# Patient Record
Sex: Male | Born: 1950 | Race: White | Hispanic: No | Marital: Single | Smoking: Never smoker
Health system: Southern US, Community
[De-identification: ages and names within clinical notes are randomized; demographics above are authoritative.]

## PROBLEM LIST (undated history)

## (undated) DIAGNOSIS — Z87442 Personal history of urinary calculi: Secondary | ICD-10-CM

## (undated) DIAGNOSIS — E785 Hyperlipidemia, unspecified: Secondary | ICD-10-CM

## (undated) DIAGNOSIS — K589 Irritable bowel syndrome without diarrhea: Secondary | ICD-10-CM

## (undated) DIAGNOSIS — Z8619 Personal history of other infectious and parasitic diseases: Secondary | ICD-10-CM

## (undated) DIAGNOSIS — J45909 Unspecified asthma, uncomplicated: Secondary | ICD-10-CM

## (undated) DIAGNOSIS — K219 Gastro-esophageal reflux disease without esophagitis: Secondary | ICD-10-CM

## (undated) DIAGNOSIS — R51 Headache: Secondary | ICD-10-CM

## (undated) DIAGNOSIS — B0229 Other postherpetic nervous system involvement: Secondary | ICD-10-CM

## (undated) HISTORY — PX: COLONOSCOPY: SHX174

## (undated) HISTORY — DX: Personal history of urinary calculi: Z87.442

## (undated) HISTORY — DX: Irritable bowel syndrome, unspecified: K58.9

## (undated) HISTORY — DX: Hyperlipidemia, unspecified: E78.5

## (undated) HISTORY — DX: Gastro-esophageal reflux disease without esophagitis: K21.9

## (undated) HISTORY — DX: Other postherpetic nervous system involvement: B02.29

## (undated) HISTORY — PX: OTHER SURGICAL HISTORY: SHX169

## (undated) HISTORY — DX: Personal history of other infectious and parasitic diseases: Z86.19

## (undated) HISTORY — DX: Unspecified asthma, uncomplicated: J45.909

## (undated) HISTORY — DX: Headache: R51

---
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
## 1995-10-28 HISTORY — PX: OTHER SURGICAL HISTORY: SHX169

## 1999-01-28 ENCOUNTER — Other Ambulatory Visit: Admission: RE | Admit: 1999-01-28 | Discharge: 1999-01-28 | Payer: Self-pay | Admitting: Otolaryngology

## 2001-03-21 ENCOUNTER — Encounter: Payer: Self-pay | Admitting: Emergency Medicine

## 2001-03-21 ENCOUNTER — Inpatient Hospital Stay (HOSPITAL_COMMUNITY): Admission: EM | Admit: 2001-03-21 | Discharge: 2001-03-22 | Payer: Self-pay | Admitting: Emergency Medicine

## 2002-06-09 ENCOUNTER — Ambulatory Visit (HOSPITAL_COMMUNITY): Admission: RE | Admit: 2002-06-09 | Discharge: 2002-06-09 | Payer: Self-pay | Admitting: Pulmonary Disease

## 2002-06-09 ENCOUNTER — Encounter: Payer: Self-pay | Admitting: Pulmonary Disease

## 2002-07-05 ENCOUNTER — Encounter: Payer: Self-pay | Admitting: Neurology

## 2002-07-05 ENCOUNTER — Ambulatory Visit (HOSPITAL_COMMUNITY): Admission: RE | Admit: 2002-07-05 | Discharge: 2002-07-05 | Payer: Self-pay | Admitting: Neurology

## 2002-07-22 ENCOUNTER — Ambulatory Visit (HOSPITAL_BASED_OUTPATIENT_CLINIC_OR_DEPARTMENT_OTHER): Admission: RE | Admit: 2002-07-22 | Discharge: 2002-07-22 | Payer: Self-pay | Admitting: Urology

## 2002-07-22 ENCOUNTER — Encounter: Payer: Self-pay | Admitting: Urology

## 2002-10-27 HISTORY — PX: OTHER SURGICAL HISTORY: SHX169

## 2003-03-21 ENCOUNTER — Emergency Department (HOSPITAL_COMMUNITY): Admission: EM | Admit: 2003-03-21 | Discharge: 2003-03-21 | Payer: Self-pay | Admitting: Emergency Medicine

## 2003-08-25 ENCOUNTER — Ambulatory Visit (HOSPITAL_COMMUNITY): Admission: RE | Admit: 2003-08-25 | Discharge: 2003-08-25 | Payer: Self-pay | Admitting: Urology

## 2003-08-25 ENCOUNTER — Ambulatory Visit (HOSPITAL_BASED_OUTPATIENT_CLINIC_OR_DEPARTMENT_OTHER): Admission: RE | Admit: 2003-08-25 | Discharge: 2003-08-25 | Payer: Self-pay | Admitting: Urology

## 2004-09-11 ENCOUNTER — Ambulatory Visit: Payer: Self-pay | Admitting: Pulmonary Disease

## 2004-10-02 ENCOUNTER — Ambulatory Visit: Payer: Self-pay | Admitting: Adult Health

## 2004-11-18 ENCOUNTER — Ambulatory Visit: Payer: Self-pay | Admitting: Pulmonary Disease

## 2005-02-13 ENCOUNTER — Emergency Department (HOSPITAL_COMMUNITY): Admission: EM | Admit: 2005-02-13 | Discharge: 2005-02-13 | Payer: Self-pay | Admitting: Emergency Medicine

## 2005-02-17 ENCOUNTER — Ambulatory Visit (HOSPITAL_COMMUNITY): Admission: RE | Admit: 2005-02-17 | Discharge: 2005-02-17 | Payer: Self-pay | Admitting: Urology

## 2005-03-10 ENCOUNTER — Ambulatory Visit: Payer: Self-pay | Admitting: Pulmonary Disease

## 2005-03-28 ENCOUNTER — Ambulatory Visit: Payer: Self-pay | Admitting: Pulmonary Disease

## 2005-04-09 ENCOUNTER — Ambulatory Visit: Payer: Self-pay | Admitting: Pulmonary Disease

## 2005-05-12 ENCOUNTER — Ambulatory Visit: Payer: Self-pay | Admitting: Pulmonary Disease

## 2005-05-20 ENCOUNTER — Encounter: Admission: RE | Admit: 2005-05-20 | Discharge: 2005-05-20 | Payer: Self-pay | Admitting: Neurology

## 2005-06-02 ENCOUNTER — Ambulatory Visit (HOSPITAL_COMMUNITY): Admission: RE | Admit: 2005-06-02 | Discharge: 2005-06-02 | Payer: Self-pay | Admitting: Neurology

## 2005-10-30 ENCOUNTER — Ambulatory Visit: Payer: Self-pay | Admitting: Pulmonary Disease

## 2005-12-09 ENCOUNTER — Ambulatory Visit: Payer: Self-pay | Admitting: Pulmonary Disease

## 2005-12-18 ENCOUNTER — Ambulatory Visit: Payer: Self-pay | Admitting: Pulmonary Disease

## 2005-12-26 ENCOUNTER — Ambulatory Visit: Payer: Self-pay | Admitting: Pulmonary Disease

## 2006-01-12 ENCOUNTER — Ambulatory Visit: Payer: Self-pay | Admitting: Pulmonary Disease

## 2006-05-19 ENCOUNTER — Ambulatory Visit: Payer: Self-pay | Admitting: Pulmonary Disease

## 2006-07-22 ENCOUNTER — Emergency Department (HOSPITAL_COMMUNITY): Admission: EM | Admit: 2006-07-22 | Discharge: 2006-07-22 | Payer: Self-pay | Admitting: Emergency Medicine

## 2006-07-28 ENCOUNTER — Ambulatory Visit: Payer: Self-pay | Admitting: Pulmonary Disease

## 2006-09-21 ENCOUNTER — Ambulatory Visit: Payer: Self-pay | Admitting: Pulmonary Disease

## 2006-09-24 ENCOUNTER — Encounter: Admission: RE | Admit: 2006-09-24 | Discharge: 2006-09-24 | Payer: Self-pay | Admitting: Otolaryngology

## 2007-01-11 ENCOUNTER — Ambulatory Visit: Payer: Self-pay | Admitting: Pulmonary Disease

## 2007-03-08 ENCOUNTER — Ambulatory Visit: Payer: Self-pay | Admitting: Pulmonary Disease

## 2007-03-09 ENCOUNTER — Ambulatory Visit: Payer: Self-pay | Admitting: Pulmonary Disease

## 2007-03-09 LAB — CONVERTED CEMR LAB
ALT: 19 units/L (ref 0–40)
Alkaline Phosphatase: 72 units/L (ref 39–117)
BUN: 14 mg/dL (ref 6–23)
Basophils Relative: 0 % (ref 0.0–1.0)
CO2: 29 meq/L (ref 19–32)
Chloride: 107 meq/L (ref 96–112)
Cholesterol: 149 mg/dL (ref 0–200)
Eosinophils Absolute: 0.1 10*3/uL (ref 0.0–0.6)
Eosinophils Relative: 1 % (ref 0.0–5.0)
GFR calc Af Amer: 89 mL/min
GFR calc non Af Amer: 74 mL/min
Glucose, Bld: 117 mg/dL — ABNORMAL HIGH (ref 70–99)
HCT: 41.9 % (ref 39.0–52.0)
HDL: 39.1 mg/dL (ref >39.0)
Monocytes Absolute: 0.5 10*3/uL (ref 0.2–0.7)
Neutro Abs: 3 10*3/uL (ref 1.4–7.7)
Neutrophils Relative %: 51.4 % (ref 43.0–77.0)
Potassium: 4.5 meq/L (ref 3.5–5.1)
RBC: 4.79 M/uL (ref 4.22–5.81)
Sed Rate: 9 mm/hr (ref 0–20)
Sodium: 143 meq/L (ref 135–145)
TSH: 1.78 microintl units/mL (ref 0.35–5.50)
Total CHOL/HDL Ratio: 3.8
Total Protein: 7.1 g/dL (ref 6.0–8.3)
Triglycerides: 188 mg/dL — ABNORMAL HIGH (ref 0–149)

## 2007-03-23 ENCOUNTER — Ambulatory Visit: Payer: Self-pay | Admitting: Pulmonary Disease

## 2007-04-09 ENCOUNTER — Ambulatory Visit: Payer: Self-pay | Admitting: Pulmonary Disease

## 2007-06-01 ENCOUNTER — Ambulatory Visit: Payer: Self-pay | Admitting: Pulmonary Disease

## 2007-06-04 ENCOUNTER — Ambulatory Visit: Payer: Self-pay | Admitting: Pulmonary Disease

## 2007-06-10 ENCOUNTER — Ambulatory Visit: Payer: Self-pay | Admitting: Pulmonary Disease

## 2007-08-28 DIAGNOSIS — J309 Allergic rhinitis, unspecified: Secondary | ICD-10-CM | POA: Insufficient documentation

## 2007-08-28 DIAGNOSIS — J209 Acute bronchitis, unspecified: Secondary | ICD-10-CM | POA: Insufficient documentation

## 2007-08-28 DIAGNOSIS — K219 Gastro-esophageal reflux disease without esophagitis: Secondary | ICD-10-CM

## 2007-08-28 DIAGNOSIS — E785 Hyperlipidemia, unspecified: Secondary | ICD-10-CM

## 2007-11-07 ENCOUNTER — Emergency Department (HOSPITAL_COMMUNITY): Admission: EM | Admit: 2007-11-07 | Discharge: 2007-11-07 | Payer: Self-pay | Admitting: Emergency Medicine

## 2007-11-11 ENCOUNTER — Ambulatory Visit (HOSPITAL_COMMUNITY): Admission: RE | Admit: 2007-11-11 | Discharge: 2007-11-11 | Payer: Self-pay | Admitting: Urology

## 2007-11-23 ENCOUNTER — Encounter: Payer: Self-pay | Admitting: Adult Health

## 2007-11-23 ENCOUNTER — Ambulatory Visit: Payer: Self-pay | Admitting: Pulmonary Disease

## 2007-11-23 DIAGNOSIS — B0229 Other postherpetic nervous system involvement: Secondary | ICD-10-CM

## 2007-11-26 ENCOUNTER — Encounter: Payer: Self-pay | Admitting: Adult Health

## 2007-12-03 ENCOUNTER — Telehealth: Payer: Self-pay | Admitting: Adult Health

## 2007-12-03 ENCOUNTER — Encounter: Payer: Self-pay | Admitting: Adult Health

## 2007-12-08 ENCOUNTER — Ambulatory Visit: Payer: Self-pay | Admitting: Pulmonary Disease

## 2007-12-14 ENCOUNTER — Encounter: Payer: Self-pay | Admitting: Pulmonary Disease

## 2007-12-23 ENCOUNTER — Telehealth (INDEPENDENT_AMBULATORY_CARE_PROVIDER_SITE_OTHER): Payer: Self-pay | Admitting: *Deleted

## 2008-02-02 ENCOUNTER — Ambulatory Visit: Payer: Self-pay | Admitting: Pulmonary Disease

## 2008-02-02 LAB — CONVERTED CEMR LAB: Streptococcus, Group A Screen (Direct): NEGATIVE

## 2008-04-03 ENCOUNTER — Telehealth (INDEPENDENT_AMBULATORY_CARE_PROVIDER_SITE_OTHER): Payer: Self-pay | Admitting: *Deleted

## 2008-04-19 ENCOUNTER — Telehealth: Payer: Self-pay | Admitting: Pulmonary Disease

## 2008-08-07 ENCOUNTER — Ambulatory Visit: Payer: Self-pay | Admitting: Pulmonary Disease

## 2008-10-12 ENCOUNTER — Telehealth (INDEPENDENT_AMBULATORY_CARE_PROVIDER_SITE_OTHER): Payer: Self-pay | Admitting: *Deleted

## 2008-11-06 ENCOUNTER — Ambulatory Visit: Payer: Self-pay | Admitting: Internal Medicine

## 2008-11-07 ENCOUNTER — Telehealth: Payer: Self-pay | Admitting: Adult Health

## 2009-01-11 ENCOUNTER — Ambulatory Visit: Payer: Self-pay | Admitting: Pulmonary Disease

## 2009-01-17 ENCOUNTER — Ambulatory Visit: Payer: Self-pay | Admitting: Pulmonary Disease

## 2009-01-17 DIAGNOSIS — R51 Headache: Secondary | ICD-10-CM | POA: Insufficient documentation

## 2009-01-17 DIAGNOSIS — K649 Unspecified hemorrhoids: Secondary | ICD-10-CM | POA: Insufficient documentation

## 2009-01-17 DIAGNOSIS — N200 Calculus of kidney: Secondary | ICD-10-CM | POA: Insufficient documentation

## 2009-01-17 DIAGNOSIS — R519 Headache, unspecified: Secondary | ICD-10-CM | POA: Insufficient documentation

## 2009-01-17 DIAGNOSIS — B029 Zoster without complications: Secondary | ICD-10-CM | POA: Insufficient documentation

## 2009-01-17 LAB — CONVERTED CEMR LAB
BUN: 18 mg/dL (ref 6–23)
Basophils Absolute: 0 10*3/uL (ref 0.0–0.1)
Basophils Relative: 0.3 % (ref 0.0–3.0)
CO2: 28 meq/L (ref 19–32)
Calcium: 8.9 mg/dL (ref 8.4–10.5)
Chloride: 107 meq/L (ref 96–112)
Cholesterol: 145 mg/dL (ref 0–200)
Creatinine, Ser: 1 mg/dL (ref 0.4–1.5)
Eosinophils Absolute: 0.1 10*3/uL (ref 0.0–0.7)
Glucose, Bld: 102 mg/dL — ABNORMAL HIGH (ref 70–99)
HDL: 40 mg/dL (ref >39.00)
LDL Cholesterol: 82 mg/dL (ref 0–99)
MCV: 88.5 fL (ref 78.0–100.0)
Monocytes Absolute: 0.5 10*3/uL (ref 0.1–1.0)
Monocytes Relative: 9.3 % (ref 3.0–12.0)
Neutro Abs: 2.4 10*3/uL (ref 1.4–7.7)
Potassium: 3.9 meq/L (ref 3.5–5.1)
Sodium: 142 meq/L (ref 135–145)
Total CHOL/HDL Ratio: 4
Triglycerides: 114 mg/dL (ref 0.0–149.0)
Urine Glucose: NEGATIVE mg/dL
VLDL: 22.8 mg/dL (ref 0.0–40.0)

## 2009-01-26 DIAGNOSIS — E559 Vitamin D deficiency, unspecified: Secondary | ICD-10-CM | POA: Insufficient documentation

## 2009-02-05 ENCOUNTER — Ambulatory Visit: Payer: Self-pay | Admitting: Internal Medicine

## 2009-02-06 DIAGNOSIS — L219 Seborrheic dermatitis, unspecified: Secondary | ICD-10-CM | POA: Insufficient documentation

## 2009-03-09 ENCOUNTER — Telehealth (INDEPENDENT_AMBULATORY_CARE_PROVIDER_SITE_OTHER): Payer: Self-pay | Admitting: *Deleted

## 2009-03-14 ENCOUNTER — Encounter: Payer: Self-pay | Admitting: Pulmonary Disease

## 2009-05-02 ENCOUNTER — Ambulatory Visit: Payer: Self-pay | Admitting: Internal Medicine

## 2009-05-30 IMAGING — CR DG ABDOMEN 1V
2 series · 2 of 2 positions shown · non-contrast
Comparison: 02/17/2005

CLINICAL DATA: Preop evaluation for left ureteral stone

[view not recorded (1 of 2)]
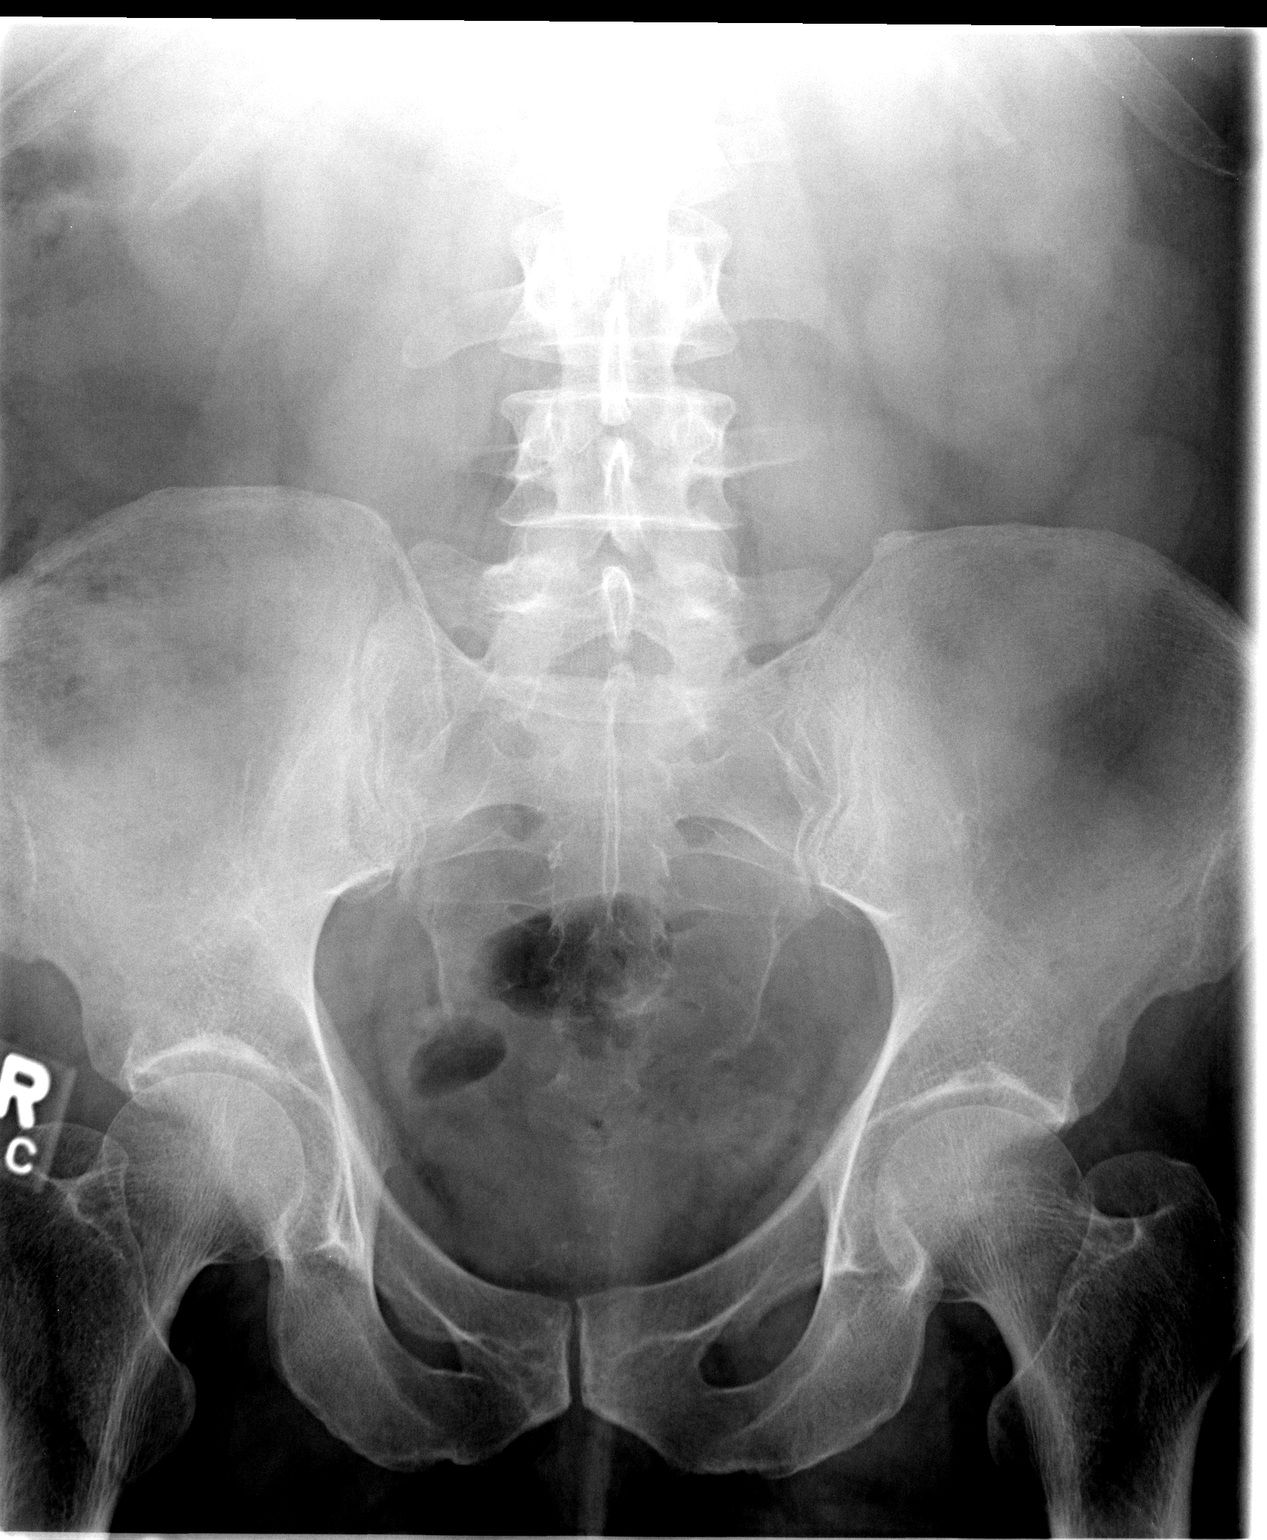

[view not recorded (2 of 2)]
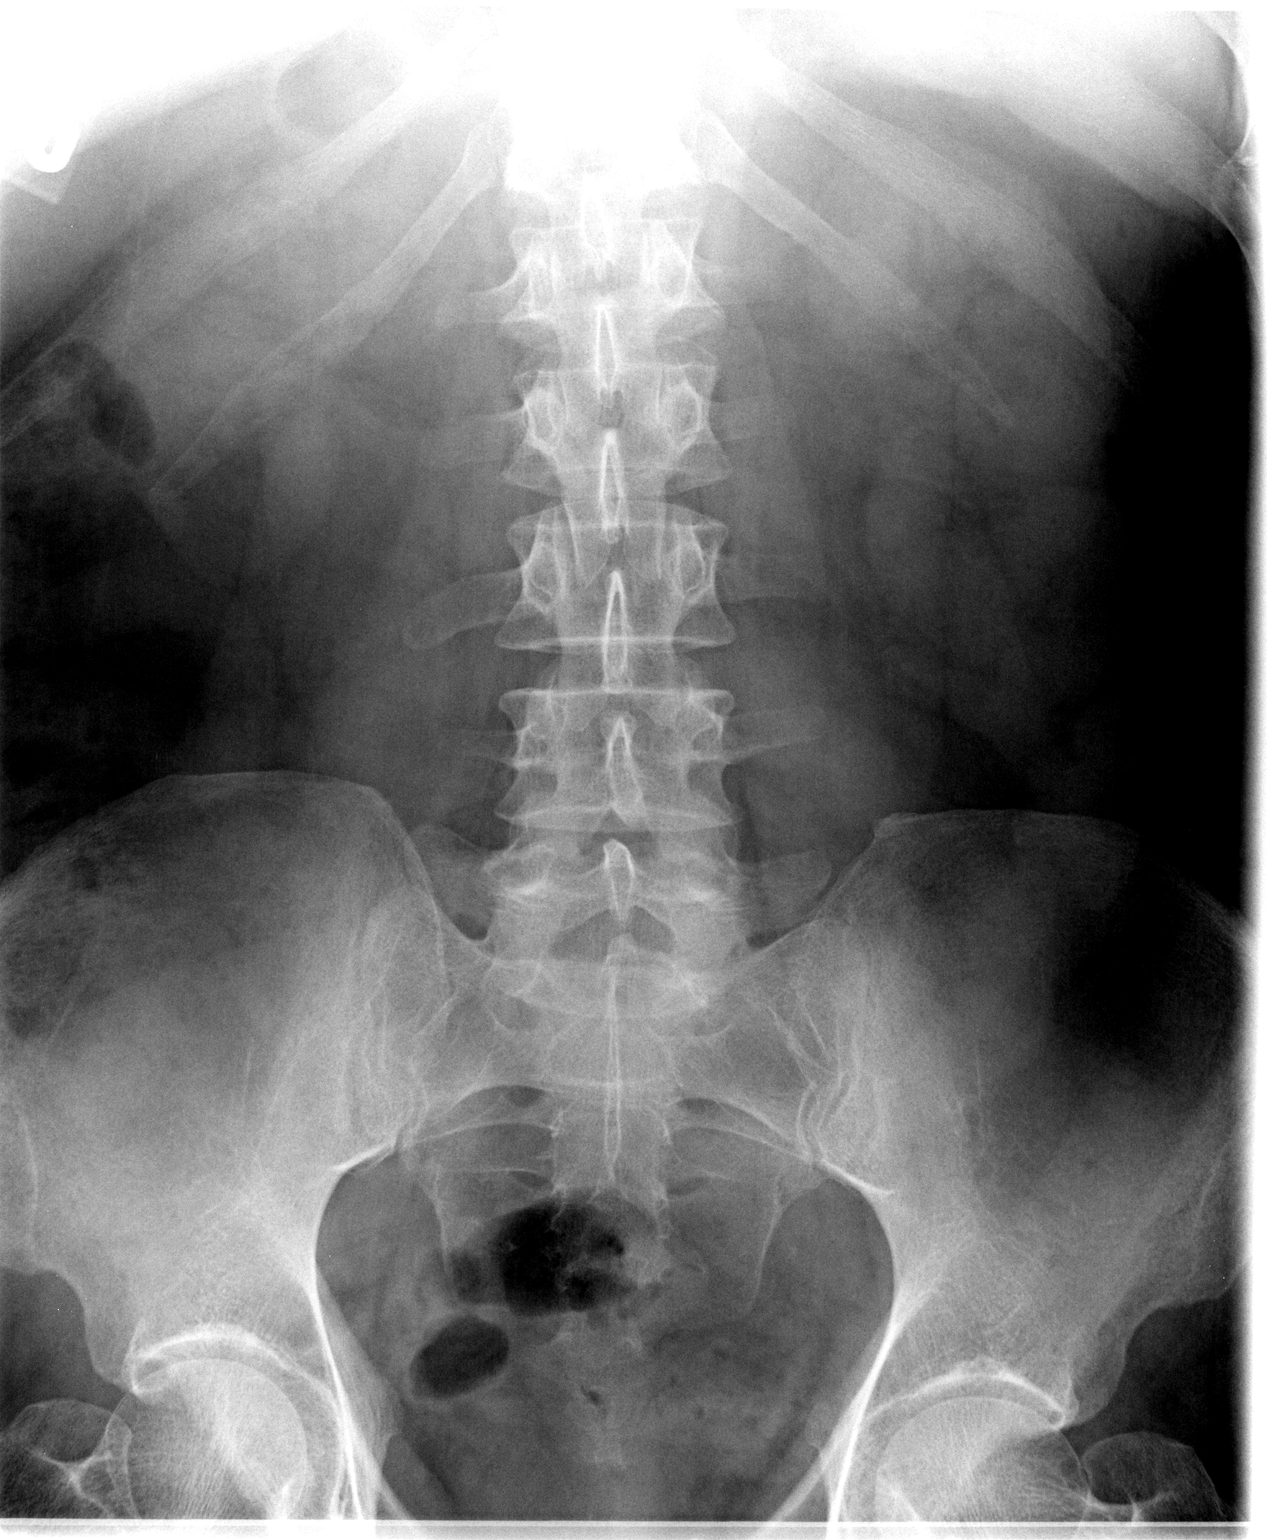

[2 of 2 positions shown; findings below may reference images not displayed]

ABDOMEN - 1 VIEW:

No definite renal or ureteral stone is identified on either side. There is a
faint opacity adjacent to the left sacrum which could be a tiny distal ureteral
stone. Visualized bony structures are unremarkable.
IMPRESSION: No definite renal or ureteral stones.

## 2009-06-11 ENCOUNTER — Telehealth (INDEPENDENT_AMBULATORY_CARE_PROVIDER_SITE_OTHER): Payer: Self-pay | Admitting: *Deleted

## 2009-08-09 ENCOUNTER — Ambulatory Visit: Payer: Self-pay | Admitting: Internal Medicine

## 2009-10-23 ENCOUNTER — Telehealth: Payer: Self-pay | Admitting: Pulmonary Disease

## 2009-12-03 ENCOUNTER — Ambulatory Visit: Payer: Self-pay | Admitting: Pulmonary Disease

## 2010-07-05 ENCOUNTER — Telehealth: Payer: Self-pay | Admitting: Pulmonary Disease

## 2010-07-08 ENCOUNTER — Ambulatory Visit: Payer: Self-pay | Admitting: Pulmonary Disease

## 2010-07-08 DIAGNOSIS — R1011 Right upper quadrant pain: Secondary | ICD-10-CM

## 2010-07-08 DIAGNOSIS — K589 Irritable bowel syndrome without diarrhea: Secondary | ICD-10-CM | POA: Insufficient documentation

## 2010-07-08 LAB — CONVERTED CEMR LAB
ALT: 19 units/L (ref 0–53)
AST: 17 units/L (ref 0–37)
Alkaline Phosphatase: 88 units/L (ref 39–117)
Amylase: 19 units/L — ABNORMAL LOW (ref 27–131)
Basophils Relative: 0.4 % (ref 0.0–3.0)
Bilirubin, Direct: 0.1 mg/dL (ref 0.0–0.3)
Calcium: 9.2 mg/dL (ref 8.4–10.5)
Chloride: 105 meq/L (ref 96–112)
Eosinophils Relative: 0.7 % (ref 0.0–5.0)
GFR calc non Af Amer: 79.51 mL/min (ref >60)
HCT: 40.8 % (ref 39.0–52.0)
Hemoglobin: 13.8 g/dL (ref 13.0–17.0)
Lymphocytes Relative: 32.4 % (ref 12.0–46.0)
Lymphs Abs: 2.4 10*3/uL (ref 0.7–4.0)
MCHC: 33.8 g/dL (ref 30.0–36.0)
MCV: 86.5 fL (ref 78.0–100.0)
Monocytes Absolute: 0.5 10*3/uL (ref 0.1–1.0)
Neutro Abs: 4.3 10*3/uL (ref 1.4–7.7)
Potassium: 4.7 meq/L (ref 3.5–5.1)
RBC: 4.72 M/uL (ref 4.22–5.81)
RDW: 14.9 % — ABNORMAL HIGH (ref 11.5–14.6)
Sed Rate: 20 mm/hr (ref 0–22)
Sodium: 142 meq/L (ref 135–145)
Total Bilirubin: 0.6 mg/dL (ref 0.3–1.2)
WBC: 7.3 10*3/uL (ref 4.5–10.5)

## 2010-07-09 ENCOUNTER — Ambulatory Visit (HOSPITAL_COMMUNITY): Admission: RE | Admit: 2010-07-09 | Discharge: 2010-07-09 | Payer: Self-pay | Admitting: Pulmonary Disease

## 2010-08-09 ENCOUNTER — Telehealth (INDEPENDENT_AMBULATORY_CARE_PROVIDER_SITE_OTHER): Payer: Self-pay | Admitting: *Deleted

## 2010-11-06 ENCOUNTER — Ambulatory Visit
Admission: RE | Admit: 2010-11-06 | Discharge: 2010-11-06 | Payer: Self-pay | Source: Home / Self Care | Attending: Pulmonary Disease | Admitting: Pulmonary Disease

## 2010-11-06 ENCOUNTER — Encounter: Payer: Self-pay | Admitting: Pulmonary Disease

## 2010-11-28 NOTE — Assessment & Plan Note (Signed)
Summary: sinus/knot on back   Primary Care Provider:  Dr. Kriste Basque   CC:  4 month ROV & add-on for sinus infection....  History of Present Illness: 60 y/o WM here for a follow up...  he has multiple medical problems as noted below...    ~  July 08, 2010:  last seen by me 3/10 for CPX & f/u of his AR, AB, Hyperlipidemia, GERD, etc... presents today w/ 3wk hx RUQ pain- below right costal margin, tender, hurts to roll over at night but NOT tender to palp on ribs of lower chest wall- assoc nausea, no vomit, +bloating, no ch in bowels, no hx GB dis or prev eval... we discussed checking Sonar & Labs>    Otherw stable- takes Levsin, Zantac as needed;  breathing satis w/o cough, SOB, etc;  no CP, paklpit, etc;  uses Valium 2mg  as needed for vertigo...   ~  November 06, 2010:  Add-on for sinusitis c/o congestion, drainage, blowing yellow "crud" for 1 wk w/ cough & beige sputum... ?fever, +chills, body aches, some HA, fatigue,etc... taking OTC Tylenol only & feeling poorly asking for 3d off work due to systemic symptoms... we discussed Rx w/ Pred, Augmentin, Mucinex, etc...   Current Problems:   ALLERGIC RHINITIS (ICD-477.9) & OTITIS MEDIA, LEFT (ICD-382.9) - uses OTC antihistamines + FLONASE... hx otitis media requiring ENT- w/ bilat tympanoplasties by DrCrossly in the past...  hx Vertigo w/ Rx Valium 2mg  in past- he had an extensive eval by DrRosen & [facility] DrMay w/ rec for [facility]  for Otolith disease...  Hx of ASTHMATIC BRONCHITIS, ACUTE (ICD-466.0) - he is a non-smoker w/ hx of recurrent bronchitic infections...  HYPERLIPIDEMIA (ICD-272.4) - on SIMVASTATIN 40mg /d...  ~  FLP 3/10 showed TChol 145, TG 114, HDL 40, LDL 82... rec> continue same.  GERD (ICD-530.81) - prev on Prilosec Prn...  ~  9/11: presented w/ 3wk hx RUQ pain> Labs all neg & Sonar- pending...  HEMORRHOIDS (ICD-455.6) - colonoscopy 8/01 by DrPatterson was neg x for some min int hems...  Hx of RENAL CALCULUS (ICD-592.0) - he  had a kidney stone on the left requiring cystoscopic stone extraction in 1997 by DrRDavis et al...  HEADACHE (ICD-784.0) - eval by DrWeymann in 7/06...  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 of decr flow in right vertebral & ? 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 subseq cerebral angiogram by DrTDeveshwar showed  Hx of HERPES ZOSTER (ICD-053.9) & POSTHERPETIC NEURALGIA (ICD-053.19) - seen 8/08 with right V1shingles- facial/eye/scalp herpes zoster, treated with Valtrex and Prednisone taper... seen by Optometrist and recommend to use suppressive anti-virals for 6 month with acyclovir- labs revealed a negative HIV test, and postive HSV I/II  IgM titer and negative HSV 2 IgG Ab... he required Lyrica for the postherpetic neuralgia and it resolved.  VITAMIN D DEFICIENCY (ICD-268.9) - Vit D level 3/10 = 18... Vit D 50,000 u weekly started.  Health Maintenance - takes ASA 81mg /d...  ~  f/u colonoscopy is now overdue ==> refer to GI DrPatterson.  ~  labs 3/10 showed PSA= 0.71;  on Viagra Prn...  ~  Immunizations:  encouraged to get yearly Flu vaccine each autumn.   Allergies: 1)  * Contac Cold Med  Comments:  Nurse/Medical Assistant: The patient's medications and allergies were reviewed with the patient and were updated in the Medication and Allergy Lists. 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 both parents are living and healthy mother has hypertension and diabetes  Social History: Reviewed history from 07/08/2010 and no changes required. 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  The patient denies anorexia, fever, weight loss, weight gain, vision loss, decreased hearing, chest pain, syncope, peripheral edema, headaches, hemoptysis, abdominal pain, melena, hematochezia, severe indigestion/heartburn, hematuria, incontinence, muscle weakness, suspicious skin lesions, transient blindness, difficulty walking, depression, unusual weight change, abnormal bleeding, enlarged lymph nodes, and angioedema.    Vital Signs:  Patient profile:   60 year old male Height:      70 inches Weight:      240 pounds O2 Sat:      98 % on Room air Temp:     97.7 degrees F oral Pulse rate:   89 / minute BP sitting:   146 / 86  (left arm) Cuff size:   regular  Vitals Entered By: Boone Master CNA/MA (November 06, 2010 11:41 AM)  O2 Flow:  Room air  Physical Exam  Additional Exam:  WD, WN, 60 y/o WM in NAD... GENERAL:  Alert & oriented; pleasant & cooperative... HEENT:  [place]/AT, EOM-wnl, PERRLA, Fundi-benign, EACs-Left ear tube in place, TMs-wnl,  NOSE-pale w/ clear discharge , THROAT-clear & wnl.   NECK:  Supple w/ full ROM; no JVD; normal carotid impulses w/o bruits; no thyromegaly or nodules palpated; no lymphadenopathy. CHEST:  Clear to P & A; without wheezes/ rales/ or rhonchi. HEART:  Regular Rhythm; without murmurs/ rubs/ or gallops. ABD: Soft, +RUQ tender on palp but costal margin is NOT tender, norm BS, no organomegaly or masses palp... EXT: w/o deformities, mild  arthritic changes, no VV, mild VI, no edema... NEURO:  CN's intact, no focal neuro deficits... DERM:  no lesions seen...    Impression & Recommendations:  Problem # 1:  Hx of ASTHMATIC BRONCHITIS, ACUTE (ICD-466.0) URI w/ Sinusitis & bronchitis... we discussed Rx w/ Depo/ Pred, Augmentin, ,Mucinex, etc... The following medications were removed from the medication list:    Zithromax Z-pak 250 Mg Tabs (Azithromycin) .Marland Kitchen... Take as directed His updated medication list for this problem includes:    Tussionex Pennkinetic Er 10-8 Mg/70ml Lqcr (Hydrocod polst-chlorphen polst) .Marland Kitchen... 1 tsp every 12 hours as needed for cough    Amoxicillin-pot Clavulanate 875-125 Mg Tabs (Amoxicillin-pot clavulanate) .Marland Kitchen... 1 tab by mouth two times a day til gone...  Orders: Admin of Therapeutic Inj  intramuscular or subcutaneous (98119) Depo- Medrol 80mg  (J1040)  Problem # 2:  HYPERLIPIDEMIA (ICD-272.4) Stable on Simva40... continue same. His updated medication list for this problem includes:    Simvastatin 40 Mg Tabs (Simvastatin) .Marland Kitchen... Take one tab by mouth once daily  Problem # 3:  GERD (ICD-530.81) Stable on meds>  continue same. His updated medication list for this problem includes:  Zantac 150 Mg Caps (Ranitidine hcl) .Marland Kitchen... Take 1 capsule by mouth once a day as needed    Levsin 0.125 Mg Tabs (Hyoscyamine sulfate) .Marland Kitchen... 1 by mouth three times a day as needed abdominal cramping  Problem # 4:  MULT MEDICAL PROBLES AS NOTED>>>  Complete Medication List: 1)  Fluticasone Propionate 50 Mcg/act Susp (Fluticasone propionate) .Marland Kitchen.. 1 spray each nostril two times a day 2)  Bayer Low Strength 81 Mg Tbec (Aspirin) .... Take one tab by mouth once daily 3)  Simvastatin 40 Mg Tabs (Simvastatin) .... Take one tab by mouth once daily 4)  Zantac 150 Mg Caps (Ranitidine hcl) .... Take 1 capsule by mouth once a day as needed 5)  Levsin 0.125 Mg Tabs (Hyoscyamine sulfate) .Marland Kitchen.. 1 by mouth three times a day as needed  abdominal cramping 6)  Viagra 100 Mg Tabs (Sildenafil citrate) .... Use as directed... 7)  Valium 2 Mg Tabs (Diazepam) .... Take one tablet by mouth every 6 hours as needed 8)  Vitamin D 81191 Unit Caps (Ergocalciferol) .... Take 1 cap by mouth each week... 9)  Tussionex Pennkinetic Er 10-8 Mg/82ml Lqcr (Hydrocod polst-chlorphen polst) .Marland Kitchen.. 1 tsp every 12 hours as needed for cough 10)  Amoxicillin-pot Clavulanate 875-125 Mg Tabs (Amoxicillin-pot clavulanate) .Marland Kitchen.. 1 tab by mouth two times a day til gone... 11)  Prednisone (pak) 5 Mg Tabs (Prednisone) .... Take as directed... 12)  Fluocinonide-e 0.05 % Crea (Fluocinonide emulsified base) .... Apply as directed...  Patient Instructions: 1)  Today we updated your med list- see below.... 2)  We decided to treat the sinus & bronchitis w/ a Depo shot & Pred Dosepak- take as directed;  AUGMENTIN one tab two times a day til gone;  & MUCINEX 2 tabs twice daily w/ plenty of fluids.Marland KitchenMarland Kitchen 3)  We also wrote for a cream as requested... 4)  Rest at home> you need 3-4d off work for this to resolve... 5)  Use Tylenol for the aches, fever, etc... 6)  Call for any questions... Prescriptions: FLUOCINONIDE-E 0.05 % CREA (FLUOCINONIDE EMULSIFIED BASE) apply as directed...  #1 tube x 1   Entered and Authorized by:   Michele Mcalpine MD   Signed by:   Michele Mcalpine MD on 11/06/2010   Method used:   Print then Give to Patient   RxID:   [id number] PREDNISONE (PAK) 5 MG TABS (PREDNISONE) take as directed...  #5mg -6d pack x 1   Entered and Authorized by:   Michele Mcalpine MD   Signed by:   Michele Mcalpine MD on 11/06/2010   Method used:   Print then Give to Patient   RxID:   [id number] AMOXICILLIN-POT CLAVULANATE 875-125 MG TABS (AMOXICILLIN-POT CLAVULANATE) 1 tab by mouth two times a day til gone...  #20 x 0   Entered and Authorized by:   Michele Mcalpine MD   Signed by:   Michele Mcalpine MD on 11/06/2010   Method used:   Print then Give to Patient   RxID:    [id number]    Medication Administration  Injection # 1:    Medication: Depo- Medrol 80mg     Diagnosis: Hx of ASTHMATIC BRONCHITIS, ACUTE (ICD-466.0)    Route: IM    Site: RUOQ gluteus    Exp Date: 04-2013    Lot #: [id number]    Mfr: Pharmacia    Patient tolerated injection without complications    Given by: Boone Master CNA/MA (November 06, 2010 12:24 PM)  Orders Added:  1)  Est. Patient Level IV [16109] 2)  Admin of Therapeutic Inj  intramuscular or subcutaneous [96372] 3)  Depo- Medrol 80mg  [J1040]

## 2010-11-28 NOTE — Assessment & Plan Note (Signed)
Summary: rt side pain/nausea/la   Primary Care Provider:  Dr. Kriste Basque   CC:  18 month ROV & eval right side pain....  History of Present Illness: 60 y/o WM here for a follow up...  he has multiple medical problems as noted below...    ~  July 08, 2010:  last seen by me 3/10 for CPX & f/u of his AR, AB, Hyperlipidemia, GERD, etc... presents today w/ 3wk hx RUQ pain- below right costal margin, tender, hurts to roll over at night but NOT tender to palp on ribs of lower chest wall- assoc nausea, no vomit, +bloating, no ch in bowels, no hx GB dis or prev eval... we discussed checking Sonar & Labs>    Otherw stable- takes Levsin, Zantac as needed;  breathing satis w/o cough, SOB, etc;  no CP, paklpit, etc;  uses Valium 2mg  as needed for vertigo...   Current Problems:   PHYSICAL EXAMINATION (ICD-V70.0) - takes ASA 81mg /d...  ~  f/u colonoscopy will be due 2011...  ~  labs 3/10 showed PSA= 0.71;  on Viagra Prn...  ~  Immunizations  ALLERGIC RHINITIS (ICD-477.9) & OTITIS MEDIA, LEFT (ICD-382.9) - uses OTC antihistamines + FLONASE... hx otitis media requiring ENT- w/ bilat tympanoplasties by DrCrossly in the past...  hx Vertigo w/ Rx Valium 2mg  in past- he had an extensive eval by DrRosen & [facility] DrMay w/ rec for [facility]  for Otolith disease...  Hx of ASTHMATIC BRONCHITIS, ACUTE (ICD-466.0) - he is a non-smoker w/ hx of recurrent bronchitic infections...  HYPERLIPIDEMIA (ICD-272.4) - on SIMVASTATIN 40mg /d...  ~  FLP 3/10 showed TChol 145, TG 114, HDL 40, LDL 82... rec> continue same.  GERD (ICD-530.81) - prev on Prilosec Prn...  ~  9/11: presented w/ 3wk hx RUQ pain> Labs all neg & Sonar- pending...  HEMORRHOIDS (ICD-455.6) - colonoscopy 8/01 by DrPatterson was neg x for some min int hems...  Hx of RENAL CALCULUS (ICD-592.0) - he had a kidney stone on the left requiring cystoscopic stone extraction in 1997 by DrRDavis et al...  HEADACHE (ICD-784.0) - eval by DrWeymann in 7/06...  ~   hx abnormal MRA 7/06 w/ ? of decr flow in right vertebral & ? 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 subseq cerebral angiogram by DrTDeveshwar showed  Hx of HERPES ZOSTER (ICD-053.9) & POSTHERPETIC NEURALGIA (ICD-053.19) - seen 8/08 with right V1shingles- facial/eye/scalp herpes zoster, treated with Valtrex and Prednisone taper... seen by Optometrist and recommend to use suppressive anti-virals for 6 month with acyclovir- labs revealed a negative HIV test, and postive HSV I/II  IgM titer and negative HSV 2 IgG Ab... he required Lyrica for the postherpetic neuralgia and it resolved.  VITAMIN D DEFICIENCY (ICD-268.9) - Vit D level 3/10 = 18... Vit D 50,000 u weekly started.   Preventive Screening-Counseling & Management  Alcohol-Tobacco     Smoking Status: never  Allergies: 1)  * Contac Cold Med  Comments:  Nurse/Medical Assistant: The patient's medications and allergies were reviewed with the patient and were updated in the Medication and Allergy Lists.  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 both parents are living and healthy mother has hypertension and diabetes  Social History: Reviewed history from 05/02/2009 and no changes required. 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  The patient denies anorexia, fever, weight loss, weight gain, vision loss, decreased hearing, hoarseness, chest pain, syncope, dyspnea on exertion, peripheral edema, prolonged cough, headaches, hemoptysis, melena, hematochezia, severe indigestion/heartburn, hematuria, incontinence, muscle weakness, suspicious skin lesions, transient blindness, difficulty walking, depression, unusual weight change, abnormal bleeding, enlarged lymph nodes, and angioedema.    Vital Signs:  Patient profile:   60 year old male Height:      70 inches Weight:      233 pounds BMI:     33.55 O2 Sat:      97 % on Room air Temp:     98.7 degrees F oral Pulse rate:   76 / minute BP sitting:   128 / 78  (left arm) Cuff size:   regular  Vitals Entered By: Randell Loop CMA (July 08, 2010 9:52 AM)  O2 Sat at Rest %:  97 O2 Flow:  Room air CC: 18 month ROV & eval right side pain... Is Patient Diabetic? No Pain Assessment Patient in pain? yes      Onset of pain  rt side pain  Comments meds updated today with pt   Physical Exam  Additional Exam:  WD, WN, 60 y/o WM in NAD... GENERAL:  Alert & oriented; pleasant & cooperative... HEENT:  [place]/AT, EOM-wnl, PERRLA, Fundi-benign, EACs-Left ear tube in place, TMs-wnl,  NOSE-pale w/ clear discharge , THROAT-clear & wnl.   NECK:  Supple w/ full ROM; no JVD; normal carotid impulses w/o bruits; no thyromegaly or nodules palpated; no lymphadenopathy. CHEST:  Clear to P & A; without wheezes/ rales/ or rhonchi. HEART:  Regular Rhythm; without murmurs/ rubs/ or gallops. ABD: Soft, +RUQ tender on palp but costal margin is NOT tender, norm BS, no organomegaly or masses palp... EXT: w/o deformities, mild arthritic changes, no VV, mild VI, no edema... NEURO:  CN's intact, no focal neuro deficits... DERM:  no lesions seen...    MISC.  Report  Procedure date:  07/08/2010  Findings:      BMP (METABOL)   Sodium                    142 mEq/L                   135-145   Potassium                 4.7 mEq/L                   3.5-5.1   Chloride                  105 mEq/L                   96-112   Carbon Dioxide            28 mEq/L                    19-32   Glucose  95 mg/dL                    91-47   BUN                       19 mg/dL                    8-29   Creatinine                1.0 mg/dL                   5.6-2.1   Calcium                   9.2 mg/dL                   3.0-86.5   GFR                       79.51 mL/min                >60  Hepatic/Liver Function Panel (HEPATIC)   Total Bilirubin           0.6 mg/dL                   7.8-4.6   Direct Bilirubin          0.1 mg/dL                   9.6-2.9   Alkaline Phosphatase      88 U/L                      39-117   AST                       17 U/L                      0-37   ALT                       19 U/L                      0-53   Total Protein             7.2 g/dL                    5.2-8.4   Albumin                   4.1 g/dL                    1.3-2.4  CBC Platelet w/Diff (CBCD)   White Cell Count          7.3 K/uL                    4.5-10.5   Red Cell Count            4.72 Mil/uL                 4.22-5.81   Hemoglobin                13.8 g/dL                   40.1-02.7   Hematocrit  40.8 %                      39.0-52.0   MCV                       86.5 fl                     78.0-100.0   Platelet Count            273.0 K/uL                  150.0-400.0   Neutrophil %              59.1 %                      43.0-77.0   Lymphocyte %              32.4 %                      12.0-46.0   Monocyte %                7.4 %                       3.0-12.0   Eosinophils%              0.7 %                       0.0-5.0   Basophils %               0.4 %                       0.0-3.0  Comments:      TSH (TSH)   FastTSH                    3.17 uIU/mL                 0.35-5.50   Amylase (AMYL)   Amylase              [L]  19 U/L                      27-131  Lipase (LIPASE)   Lipase                    36.0 U/L                    11.0-59.0   Sed Rate (ESR)   Sed Rate                  20 mm/hr                    0-22   *** ABD ULTRASOUND *** pending at this time...   Impression & Recommendations:  Problem # 1:  ABDOMINAL PAIN, RIGHT UPPER QUADRANT (ICD-789.01) RUQ pain > r/o GB related... labs look OK, Sonar is pending... Orders: TLB-BMP (Basic Metabolic Panel-BMET) (80048-METABOL) TLB-Hepatic/Liver Function Pnl (80076-HEPATIC) TLB-CBC Platelet - w/Differential (85025-CBCD) TLB-TSH (Thyroid Stimulating Hormone) (84443-TSH) TLB-Amylase (82150-AMYL) TLB-Lipase (83690-LIPASE) TLB-Sedimentation Rate (ESR) (85652-ESR) T-Vitamin D (25-Hydroxy) (30865-78469) Ultrasound (Ultrasound)  Problem # 2:  ALLERGIC RHINITIS (ICD-477.9) Continue antihist & Flonase Rx... His updated medication list for this problem  includes:    Fluticasone Propionate 50 Mcg/act Susp (Fluticasone propionate) .Marland Kitchen... 1 spray each nostril two times a day  Problem # 3:  HYPERLIPIDEMIA (ICD-272.4)  Continue the Simva40 regularly & he will need FLP soon (no fasting today)... His updated medication list for this problem includes:    Simvastatin 40 Mg Tabs (Simvastatin) .Marland Kitchen... Take one tab by mouth once daily  Orders: TLB-BMP (Basic Metabolic Panel-BMET) (80048-METABOL) TLB-Hepatic/Liver Function Pnl (80076-HEPATIC) TLB-CBC Platelet - w/Differential (85025-CBCD) TLB-TSH (Thyroid Stimulating Hormone) (84443-TSH) TLB-Amylase (82150-AMYL) TLB-Lipase (83690-LIPASE) TLB-Sedimentation Rate (ESR) (85652-ESR) T-Vitamin D (25-Hydroxy) (16109-60454)  Problem # 4:  GERD (ICD-530.81) Encouraged to take the Zantac more regularly & given ACIPHEX 20mg /d samples until eval is completed... His updated medication list for this problem includes:    Zantac 150  Mg Caps (Ranitidine hcl) .Marland Kitchen... Take 1 capsule by mouth once a day as needed    Levsin 0.125 Mg Tabs (Hyoscyamine sulfate) .Marland Kitchen... 1 by mouth three times a day as needed abdominal cramping  Problem # 5:  HEMORRHOIDS (ICD-455.6) He is due for 66yr f/u colon w/ DrPatterson....  Problem # 6:  Hx of RENAL CALCULUS (ICD-592.0) Followed by Urology> no recent stones...   Problem # 7:  OTHER MEDICAL PROBLEMS AS NOTED>>>  Complete Medication List: 1)  Fluticasone Propionate 50 Mcg/act Susp (Fluticasone propionate) .Marland Kitchen.. 1 spray each nostril two times a day 2)  Bayer Low Strength 81 Mg Tbec (Aspirin) .... Take one tab by mouth once daily 3)  Simvastatin 40 Mg Tabs (Simvastatin) .... Take one tab by mouth once daily 4)  Zantac 150 Mg Caps (Ranitidine hcl) .... Take 1 capsule by mouth once a day as needed 5)  Levsin 0.125 Mg Tabs (Hyoscyamine sulfate) .Marland Kitchen.. 1 by mouth three times a day as needed abdominal cramping 6)  Viagra 100 Mg Tabs (Sildenafil citrate) .... Use as directed... 7)  Valium 2 Mg Tabs (Diazepam) .... Take one tablet by mouth every 6 hours as needed 8)  Vitamin D 09811 Unit Caps (Ergocalciferol) .... Take 1 cap by mouth each week...  Other Orders: Admin 1st Vaccine (91478) Flu Vaccine 68yrs + [id number])  Patient Instructions: 1)  Today we updated your med list- see below.... 2)  Continue your current meds the same for now & add the ACIPHEX 20mg  samples- one tab daily in the AM..Marland Kitchen 3)  We will arrange for an ASAP Abd Ulrasound exam to check your liver & GB.Marland KitchenMarland Kitchen 4)  Today we did your follow up lab work... 5)  We will call you w/ these results when avail.Marland KitchenMarland Kitchen 6)  Today we gave you the 2011 Flu vaccine... 7)  Call for any questions...   Flu Vaccine Consent Questions     Do you have a history of severe allergic reactions to this vaccine? no    Any prior history of allergic reactions to egg and/or gelatin? no    Do you have a sensitivity to the preservative Thimersol? no    Do you have a past  history of Guillan-Barre Syndrome? no    Do you currently have an acute febrile illness? no    Have you ever had a severe reaction to latex? no    Vaccine information given and explained to patient? yes    Are you currently pregnant? no    Lot Number:[id number]   Exp Date:04/26/2011   Site Given  Left Deltoid IMflu   Randell Loop [facility]  July 08, 2010 11:33 AM

## 2010-11-28 NOTE — Progress Notes (Signed)
Summary: sick-cough----rx for tussionex and z pak  Phone Note Call from Patient   Caller: Russell Bullock Call For: nadel Summary of Call: hacking cough, drainage nose runny feels real bad would like [facility] [phone] Initial call taken by: Oneita Jolly,  August 09, 2010 10:41 AM  Follow-up for Phone Call        called and spoke with pt.  pt last saw SN 07/08/2010.  Pt states Sx started approx 2 days ago.   Pt c/o scratchy throat, hoarseness, PND, dry-"hacky" cough throughout the day but states he will cough up "off-white colored" sputum in the mornings, runny nose with clear nasal drainage, head congestion, increased coughing when he takes in a deep breath.  Pt denied fever/chills/sweats. Pt states he is leaving to [facility] on "Sunday x 12 days and would like rx for Sx.  Will forward message to SN to address.  Megan Reynolds LPN  August 09, 2010 10:48 AM    Allergies:  1)  * Contac Cold Med  Additional Follow-up for Phone Call Additional follow up Details #1::        per sn ok for zpak #1 as directed, tussionex 4oz 1 tsp every 12hrs as needed for cough and otc mucinex 2 by mouth twice a day with fluids. Tammy Davis CMA  August 09, 2010 12:03 PM     Additional Follow-up for Phone Call Additional follow up Details #2::    called and spoke with pt.  pt aware of SN's recs and rx sent to pharmacy.  Megan Reynolds LPN  August 09, 2010 1:13 PM   New/Updated Medications: ZITHROMAX Z-PAK 250 MG TABS (AZITHROMYCIN) take as directed TUSSIONEX PENNKINETIC ER 10-8 MG/5ML LQCR (HYDROCOD POLST-CHLORPHEN POLST) 1 tsp every 12 hours as needed for cough Prescriptions: TUSSIONEX PENNKINETIC ER 10-8 MG/5ML LQCR (HYDROCOD POLST-CHLORPHEN POLST) 1 tsp every 12 hours as needed for cough  #4 oz x 0   Entered by:   Megan Reynolds LPN   Authorized by:   Scott M Nadel MD   Signed by:   Megan Reynolds LPN on 08/09/2010   Method used:   Telephoned to ...       [id number]       [address]       [place]       [place], [place]  [address]       Ph: [phone] or [phone]       Fax: [phone]   RxID:   [id number] ZITHROMAX Z-PAK 250 MG TABS (AZITHROMYCIN) take as directed  #1 x 0   Entered by:   Megan Reynolds LPN   Authorized by:   Scott M Nadel MD   Signed by:   Megan Reynolds LPN on 08/09/2010   Method used:   Telephoned to ...       [id number] ([facility]ail[facility]       [address]       [place], [place]  [address]       Ph: [phone] or [phone]       Fax: [phone]   RxID:   [id number]

## 2010-11-28 NOTE — Progress Notes (Signed)
Summary: pain in side-  Phone Note Call from Patient   Caller: Russell Bullock Call For: nadel Summary of Call: rt side pain below rib cage wants to be seen today Initial call taken by: Oneita Jolly,  July 05, 2010 8:45 AM  Follow-up for Phone Call        [id number]. [name] CMA  July 05, 2010 8:52 AM   Additional Follow-up for Phone Call Additional follow up Details #1::        Was informed by [name] that the pt called her back and that we should call his cell phone.  I called and spoke with pt.  He is c/o pain under rib area (rt side) x several days- pain worse when he presses on this area and makes him feel nauseated. Pain woke him up last night when he rolled over on his side  He states that he feels "bloated all the time".  No change in bowels, vomiting, or other complaints.  Has sched ov with TP for 07/08/10- I advised ER sooner if worsens.  Will forward to SN to see if has further recs. Additional Follow-up by: [name],  July 05, 2010 4:22 PM    Additional Follow-up for Phone Call Additional follow up Details #2::    per SN----??if severe---er now for eval  ---consider ?GB---called and spoke with pt and he stated that if he gets worse over the weekend he will go to the er for eval---he stated that he has not felt up to par today but he did make appt with TP on monday at 3---SN had a cancellation at 10am so we cancelled the appt with TP and made the one with SN.  pt is aware of this and will call for any questions. [name]  July 05, 2010 4:44 PM

## 2010-11-28 NOTE — Assessment & Plan Note (Signed)
Summary: Acute NP office visit - cough   Primary Provider/Referring Provider:  Dr. Kriste Basque   CC:  prod cough with yellow/green mucus, sinus pressure/congestion with colored mucus, PND, increased fatigue/malaise x2days - denies f/c/s, and took some tussionex had at home.  History of Present Illness: The patient is a 60 -year-old white male patient of Dr. Kriste Basque, who has a known history of asthmatic bronchitis, hyperlipidemia and gastroesophageal reflux.   August 08, 2008---Complains over last few days of increased ear drainage and discomfort, no fever, injury, . mild sinus congestion no discolored mucus. Denies chest pain, dyspnea, orthopnea, hemoptysis, fever, n/v/d, edema, recent antibiotics. no recent labs - not fasting. - rx Augmentin 875mg  x 7d   November 06, 2008 --Presents for abdominal bloating, increased after eating, soreness along lower abd,. has a hemorrhoid, using prep H -better. has been heavy lifting at work [employer]. )    February 05, 2009--Presents for small sores in scalp .Marland KitchenLooks similar to shingles he had last year. but all over scalp. no significant pain. Has been presents for several weeks. Also tingling along right eye is starting again, feels like skin is crawling at times, no sign pain.   May 02, 2009--Presents for acute office visit. Complains of poss earache:  left ear stopped up with some drainage x5days. No change in hearing, no fever, Used qtip few days ago. .Denies chest pain, dyspnea, orthopnea, hemoptysis, fever, n/v/d, edema, headache.   August 09, 2009 --Presents for work in visit. 1. Stopped lyrica , scalp pain better, felt tired on lyrica so stopped. Doing weel .2. leaving on flight for work next week, concerned w/ nasal stuffiness, drianage, and congestion. Mainly clear. No fever or discolored mucus. Denies chest pain, dyspnea, orthopnea, hemoptysis, fever, n/v/d, edema, headache,recent travel or antibiotics.     December 03, 2009--Presents for an acute  office visit. Complains of prod cough with yellow/green mucus, sinus pressure/congestion with colored mucus, PND, increased fatigue/malaise x6 days - Did not take flu shot. No fever or body aches. OTC not working. Denies chest pain, dyspnea, orthopnea, hemoptysis, fever, n/v/d, edema, headache.   Medications Prior to Update: 1)  Bayer Low Strength 81 Mg  Tbec (Aspirin) .... Take One Tab By Mouth Once Daily 2)  Simvastatin 40 Mg  Tabs (Simvastatin) .... Take One Tab By Mouth Once Daily 3)  Levsin 0.125 Mg Tabs (Hyoscyamine Sulfate) .Marland Kitchen.. 1 By Mouth Three Times A Day As Needed Abdominal Cramping 4)  Viagra 100 Mg Tabs (Sildenafil Citrate) .... Use As Directed... 5)  Valium 2 Mg  Tabs (Diazepam) .... Take One Tablet By Mouth Every 6 Hours As Needed 6)  Vitamin D 16109 Unit Caps (Ergocalciferol) .... Take 1 Cap By Mouth Each Week... 7)  Zantac 150 Mg Caps (Ranitidine Hcl) .... Take 1 Capsule By Mouth Once A Day As Needed 8)  Cipro Hc 0.2-1 % Susp (Ciprofloxacin-Hydrocortisone) .... 3 Gtt To Left Ear Two Times A Day  Current Medications (verified): 1)  Bayer Low Strength 81 Mg  Tbec (Aspirin) .... Take One Tab By Mouth Once Daily 2)  Simvastatin 40 Mg  Tabs (Simvastatin) .... Take One Tab By Mouth Once Daily 3)  Levsin 0.125 Mg Tabs (Hyoscyamine Sulfate) .Marland Kitchen.. 1 By Mouth Three Times A Day As Needed Abdominal Cramping 4)  Viagra 100 Mg Tabs (Sildenafil Citrate) .... Use As Directed... 5)  Valium 2 Mg  Tabs (Diazepam) .... Take One Tablet By Mouth Every 6 Hours As Needed 6)  Vitamin D 60454 Unit  Caps (Ergocalciferol) .... Take 1 Cap By Mouth Each Week... 7)  Zantac 150 Mg Caps (Ranitidine Hcl) .... Take 1 Capsule By Mouth Once A Day As Needed 8)  Fluticasone Propionate 50 Mcg/act Susp (Fluticasone Propionate) .Marland Kitchen.. 1 Spray Each Nostril Two Times A Day  Allergies (verified): 1)  * Contac Cold Med  Past History:  Past Medical History: Last updated: 02/05/2009   PHYSICAL EXAMINATION (ICD-V70.0)  ~   f/u colonoscopy will be due 2011...  ~  he is up to date on GU testing...  ~  Immunizations  ALLERGIC RHINITIS (ICD-477.9) & OTITIS MEDIA, LEFT (ICD-382.9) - uses OTC antihistamines + RHINOCORT AQUA... hx otitis media requiring ENT- w/ bilat tympanoplasties by DrCrossly in the past...  hx Vertigo w/ Rx Valium 2mg  in past- he had an extensive eval by DrRosen & [facility] DrMay w/ rec for Vestib Rehab  for Otolith disease...  Hx of ASTHMATIC BRONCHITIS, ACUTE (ICD-466.0) - he is a non-smoker w/ hx of recurrent bronchitic infections...  HYPERLIPIDEMIA (ICD-272.4) - on SIMVASTATIN 40mg /d...  ~  FLP 3/10 showed TChol 145, TG 114, HDL 40, LDL 82... rec> continue same.  GERD (ICD-530.81) - prev on Prilosec Prn...  HEMORRHOIDS (ICD-455.6) - colonoscopy 8/01 by DrPatterson was neg x for some min int hems...  Hx of RENAL CALCULUS (ICD-592.0) - he had a kidney stone on the left requiring cystoscopic stone extraction in 1997 by DrRDavis et al...  HEADACHE (ICD-784.0) - eval by DrWeymann in 7/06...  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 of decr flow in right vertebral & ? 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 subseq cerebral angiogram by DrTDeveshwar showed  Hx of HERPES ZOSTER (ICD-053.9) & POSTHERPETIC NEURALGIA (ICD-053.19) - seen 8/08 with right V1shingles- facial/eye/scalp herpes zoster, treated with Valtrex and Prednisone taper... seen by Optometrist and recommend to use suppressive anti-virals for 6 month with acyclovir- labs revealed a negative HIV test, and postive HSV I/II  IgM titer and negative HSV 2 IgG Ab... he required Lyrica for the postherpetic neuralgia and it resolved.---restarted on lyrica February 05, 2009  VITAMIN D DEFICIENCY (ICD-268.9) - Vit D level 3/10 = 18... Vit D 50,000 u weekly started.     Past Surgical History: Last updated: 01/17/2009 S/P excision of thyroglossal duct cyst in 1954 S/P bilat tympanomastoid surgery S/P cystoscopic left renal stone extraction in 1997 by  DrRDavis S/P right ureteroscopic stone extraction in 2004 by DrOttelin  Family History: Last updated: 05/02/2009 both parents are living and healthy mother has hypertension and diabetes  Social History: Last updated: 05/02/2009 social alcohol  works as Marine scientist for [employer] never smoker single no children  Risk Factors: Smoking Status: never (08/28/2007)  Review of Systems      See HPI  Vital Signs:  Patient profile:   60 year old male Height:      70 inches Weight:      242.13 pounds BMI:     34.87 O2 Sat:      96 % on Room air Temp:     97.6 degrees F oral Pulse rate:   78 / minute BP sitting:   146 / 110  (left arm) Cuff size:   regular  Vitals Entered By: Boone Master CNA (December 03, 2009 11:16 AM)  O2 Flow:  Room air CC: prod cough with yellow/green mucus, sinus pressure/congestion with colored mucus, PND, increased fatigue/malaise x2days - denies f/c/s, took some tussionex had at home Is Patient Diabetic? No Comments Medications reviewed  with patient Daytime contact number verified with patient. Boone Master CNA  December 03, 2009 11:16 AM    Physical Exam  Additional Exam:  WD, WN, 60 y/o WM in NAD... GENERAL:  Alert & oriented; pleasant & cooperative... HEENT:  Swartz/AT, EOM-wnl, PERRLA, Fundi-benign, EACs-Left ear tube in place  scant drainage,  TMs-wnl, NOSE-pale w/ clear discharge , THROAT-clear & wnl.   NECK:  Supple w/ full ROM; no JVD; normal carotid impulses w/o bruits; no thyromegaly or nodules palpated; no lymphadenopathy. CHEST:  Clear to P & A; without wheezes/ rales/ or rhonchi. HEART:  Regular Rhythm; without murmurs/ rubs/ or gallops. EXT: normal, no edema.      Impression & Recommendations:  Problem # 1:  UPPER RESPIRATORY INFECTION (ICD-465.9)  Zpack take as directed.  Mucinex DM two times a day as needed cough/congesiton  Tussionex 1 tsp every 12 hr as needed cough, may make you sleepy.  Increase fluids, tylenol  as needed  Please contact office for sooner follow up if symptoms do not improve or worsen  His updated medication list for this problem includes:    Bayer Low Strength 81 Mg Tbec (Aspirin) .Marland Kitchen... Take one tab by mouth once daily    Tussionex Pennkinetic Er 8-10 Mg/45ml Lqcr (Chlorpheniramine-hydrocodone) .Marland Kitchen... 1 tsp two times a day as needed cough  Orders: Est. Patient Level III (62130)  Medications Added to Medication List This Visit: 1)  Fluticasone Propionate 50 Mcg/act Susp (Fluticasone propionate) .Marland Kitchen.. 1 spray each nostril two times a day 2)  Zithromax Z-pak 250 Mg Tabs (Azithromycin) .... Take as directed. 3)  Tussionex Pennkinetic Er 8-10 Mg/73ml Lqcr (Chlorpheniramine-hydrocodone) .Marland Kitchen.. 1 tsp two times a day as needed cough  Complete Medication List: 1)  Bayer Low Strength 81 Mg Tbec (Aspirin) .... Take one tab by mouth once daily 2)  Simvastatin 40 Mg Tabs (Simvastatin) .... Take one tab by mouth once daily 3)  Levsin 0.125 Mg Tabs (Hyoscyamine sulfate) .Marland Kitchen.. 1 by mouth three times a day as needed abdominal cramping 4)  Viagra 100 Mg Tabs (Sildenafil citrate) .... Use as directed... 5)  Valium 2 Mg Tabs (Diazepam) .... Take one tablet by mouth every 6 hours as needed 6)  Vitamin D 86578 Unit Caps (Ergocalciferol) .... Take 1 cap by mouth each week... 7)  Zantac 150 Mg Caps (Ranitidine hcl) .... Take 1 capsule by mouth once a day as needed 8)  Fluticasone Propionate 50 Mcg/act Susp (Fluticasone propionate) .Marland Kitchen.. 1 spray each nostril two times a day 9)  Zithromax Z-pak 250 Mg Tabs (Azithromycin) .... Take as directed. 10)  Tussionex Pennkinetic Er 8-10 Mg/36ml Lqcr (Chlorpheniramine-hydrocodone) .Marland Kitchen.. 1 tsp two times a day as needed cough  Patient Instructions: 1)  Zpack take as directed.  2)  Mucinex DM two times a day as needed cough/congesiton  3)  Tussionex 1 tsp every 12 hr as needed cough, may make you sleepy.  4)  Increase fluids, tylenol as needed  5)  Please contact office for  sooner follow up if symptoms do not improve or worsen  Prescriptions: TUSSIONEX PENNKINETIC ER 8-10 MG/5ML LQCR (CHLORPHENIRAMINE-HYDROCODONE) 1 tsp two times a day as needed cough  #4 oz x 0   Entered and Authorized by:   Rubye Oaks NP   Signed by:   Bennett Ram NP on 12/03/2009   Method used:   Print then Give to Patient   RxID:   [id number] ZITHROMAX Z-PAK 250 MG TABS (AZITHROMYCIN) take as directed.  #1 x 0  Entered and Authorized by:   Rubye Oaks NP   Signed by:   Nicky Milhouse NP on 12/03/2009   Method used:   Electronically to        [facility] [address] [phone][facility] (retail)       [address]       [place], [place]  [address]       Ph: [phone] or [phone]       Fax: [phone]   RxID:   [id number]

## 2010-11-28 NOTE — Letter (Signed)
Summary: Out of Work  [employer]  [address]   [place], [place] [address]   Phone: [phone]  Fax: [phone]    November 06, 2010   Employee:  KIETH HARTIS    To Whom It May Concern:   For Medical reasons, please excuse the above named employee from work for the following dates:  Start:   Wednesday November 06, 2010  End:   Friday November 08, 2010.  May return to work on November 11, 2010.  If you need additional information, please feel free to contact our office.         Sincerely,         Alroy Dust, M.D.

## 2010-12-13 ENCOUNTER — Telehealth (INDEPENDENT_AMBULATORY_CARE_PROVIDER_SITE_OTHER): Payer: Self-pay | Admitting: *Deleted

## 2010-12-17 ENCOUNTER — Encounter: Payer: Self-pay | Admitting: Pulmonary Disease

## 2010-12-18 NOTE — Progress Notes (Signed)
Summary: possible vertigo or virus  Phone Note Call from Patient   Caller: Patient Call For: nadel Summary of Call: Patient phoned stated that he had vertigo maybe a year ago and he was on his way home on Wednesday and he threw up five times coming down the road and again when he got home yesterday it was letting up some he kept gatoride down, this morning he woke up around 3:30 real swimmy headed and sick again. He doesnt know if this is vertigo or if he has a virus. Patient would like a call back he can be reached at [phone] Initial call taken by: Vedia Coffer,  December 13, 2010 8:42 AM  Follow-up for Phone Call        called and spoke with pt ---he stated that he thinks this is vertigo---started on wed with some dizziness and nausea---vomited x 14 times all day and thursday he started to feel better then started back with the dizziness and he was ok if he kept his eyes open--he has valium for this but he thinks he waited too long before he took the first one---feeling some better now but this episode scared him since he has never had this for 3 days straight.  please advise.  thanks Randell Loop CMA  December 13, 2010 1:32 PM   Additional Follow-up for Phone Call Additional follow up Details #1::        per SN----use phenergan supp 25mg    #6  insert 1 every 6 hours as needed , antivert 25mg    #30   1 by mouth every 4 hours as needed for dizziness, clear liquids and advance as tolerated.  thanks Randell Loop CMA  December 13, 2010 4:39 PM     Additional Follow-up for Phone Call Additional follow up Details #2::    [facility], pt has been informed of Dr Kriste Basque recommendations .Kandice Hams [facility]  December 13, 2010 4:52 PM  Follow-up by: Kandice Hams CMA,  December 13, 2010 4:52 PM  New/Updated Medications: PROMETHAZINE HCL 25 MG SUPP (PROMETHAZINE HCL) insert 1 every 6 hours prn ANTIVERT 25 MG TABS (MECLIZINE HCL) 1 by mouth every 4 hours as needed for  dizziness Prescriptions: ANTIVERT 25 MG TABS (MECLIZINE HCL) 1 by mouth every 4 hours as needed for dizziness  #30 x 0   Entered by:   Kandice Hams CMA   Authorized by:   Michele Mcalpine MD   Signed by:   Kandice Hams CMA on 12/13/2010   Method used:   Faxed to ...       [facility] [address] [phone][facility] (retail)       [address]       [place], [place]  [address]       Ph: [phone] or [phone]       Fax: [phone]   RxID:   [id number] PROMETHAZINE HCL 25 MG SUPP (PROMETHAZINE HCL) insert 1 every 6 hours prn  #6 x 0   Entered by:   Kandice Hams CMA   Authorized by:   Michele Mcalpine MD   Signed by:   Kandice Hams CMA on 12/13/2010   Method used:   Electronically to        [facility] [address] [phone]* (retail)       [address]       [place], [place]  [address]  Ph: [phone] or [phone]       Fax: [phone]   RxID:   [id number]

## 2011-01-02 NOTE — Letter (Signed)
Summary: Serena Colonel MD/[facility]  Serena Colonel MD/ ENT   Imported By: Lester Bolivar 12/24/2010 08:44:06  _____________________________________________________________________  External Attachment:    Type:   Image     Comment:   External Document

## 2011-01-07 ENCOUNTER — Encounter (INDEPENDENT_AMBULATORY_CARE_PROVIDER_SITE_OTHER): Payer: Self-pay | Admitting: *Deleted

## 2011-01-07 ENCOUNTER — Ambulatory Visit (INDEPENDENT_AMBULATORY_CARE_PROVIDER_SITE_OTHER): Payer: Managed Care, Other (non HMO) | Admitting: Adult Health

## 2011-01-07 ENCOUNTER — Encounter: Payer: Self-pay | Admitting: Adult Health

## 2011-01-07 ENCOUNTER — Telehealth: Payer: Self-pay | Admitting: Adult Health

## 2011-01-07 DIAGNOSIS — H8110 Benign paroxysmal vertigo, unspecified ear: Secondary | ICD-10-CM

## 2011-01-08 DIAGNOSIS — H8110 Benign paroxysmal vertigo, unspecified ear: Secondary | ICD-10-CM | POA: Insufficient documentation

## 2011-01-14 NOTE — Assessment & Plan Note (Signed)
Summary: vertigo/mhh   Primary Provider/Referring Provider:  Dr. Kriste Basque   CC:  Pt c/o being unsteady on feet with nausea with sudden movements since Feb 15.Marland Kitchen  History of Present Illness: The patient is a 60 -year-old white male patient of Dr. Kriste Basque, who has a known history of asthmatic bronchitis, hyperlipidemia and gastroesophageal reflux, Vertigo   January 07, 2011 --Presents for an acute office visit. Complains of persistent dizziness. Developed dizziness with movement 1 month ago. Initially had n/v with movement-that has resolved except for occasional waves of nausea. Was seen by ENT and rx meclizine. He is only using at night. Dizziness is better but has not totally resolved. Worse with turning , bending over and looking in direction fast.  Also happens when he stands up or turns over in bed. Denies chest pain, dyspnea, orthopnea, hemoptysis, fever, v/d, edema, headache, visual/speech changes, ext weakness.     Preventive Screening-Counseling & Management  Alcohol-Tobacco     Smoking Status: never  Medications Prior to Update: 1)  Fluticasone Propionate 50 Mcg/act Susp (Fluticasone Propionate) .Marland Kitchen.. 1 Spray Each Nostril Two Times A Day 2)  Bayer Low Strength 81 Mg  Tbec (Aspirin) .... Take One Tab By Mouth Once Daily 3)  Simvastatin 40 Mg  Tabs (Simvastatin) .... Take One Tab By Mouth Once Daily 4)  Zantac 150 Mg Caps (Ranitidine Hcl) .... Take 1 Capsule By Mouth Once A Day As Needed 5)  Levsin 0.125 Mg Tabs (Hyoscyamine Sulfate) .Marland Kitchen.. 1 By Mouth Three Times A Day As Needed Abdominal Cramping 6)  Viagra 100 Mg Tabs (Sildenafil Citrate) .... Use As Directed... 7)  Valium 2 Mg  Tabs (Diazepam) .... Take One Tablet By Mouth Every 6 Hours As Needed 8)  Vitamin D 25366 Unit Caps (Ergocalciferol) .... Take 1 Cap By Mouth Each Week... 9)  Tussionex Pennkinetic Er 10-8 Mg/27ml Lqcr (Hydrocod Polst-Chlorphen Polst) .Marland Kitchen.. 1 Tsp Every 12 Hours As Needed For Cough 10)  Amoxicillin-Pot Clavulanate  875-125 Mg Tabs (Amoxicillin-Pot Clavulanate) .Marland Kitchen.. 1 Tab By Mouth Two Times A Day Til Gone... 11)  Prednisone (Pak) 5 Mg Tabs (Prednisone) .... Take As Directed... 12)  Fluocinonide-E 0.05 % Crea (Fluocinonide Emulsified Base) .... Apply As Directed... 13)  Promethazine Hcl 25 Mg Supp (Promethazine Hcl) .... Insert 1 Every 6 Hours Prn 14)  Antivert 25 Mg Tabs (Meclizine Hcl) .Marland Kitchen.. 1 By Mouth Every 4 Hours As Needed For Dizziness  Current Medications (verified): 1)  Fluticasone Propionate 50 Mcg/act Susp (Fluticasone Propionate) .Marland Kitchen.. 1 Spray Each Nostril Two Times A Day 2)  Bayer Low Strength 81 Mg  Tbec (Aspirin) .... Take One Tab By Mouth Once Daily 3)  Simvastatin 40 Mg  Tabs (Simvastatin) .... Take One Tab By Mouth Once Daily 4)  Zantac 150 Mg Caps (Ranitidine Hcl) .... Take 1 Capsule By Mouth Once A Day As Needed 5)  Levsin 0.125 Mg Tabs (Hyoscyamine Sulfate) .Marland Kitchen.. 1 By Mouth Three Times A Day As Needed Abdominal Cramping 6)  Viagra 100 Mg Tabs (Sildenafil Citrate) .... Use As Directed... 7)  Valium 2 Mg  Tabs (Diazepam) .... Take One Tablet By Mouth Every 6 Hours As Needed 8)  Vitamin D 44034 Unit Caps (Ergocalciferol) .... Take 1 Cap By Mouth Each Week... 9)  Fluocinonide-E 0.05 % Crea (Fluocinonide Emulsified Base) .... Apply As Directed... 10)  Antivert 25 Mg Tabs (Meclizine Hcl) .Marland Kitchen.. 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)  Past Surgical History: Last updated: 11/06/2010 S/P excision of  thyroglossal duct cyst in 1954 S/P bilat tympanomastoid surgery S/P cystoscopic left renal stone extraction in 1997 by DrRDavis S/P right ureteroscopic stone extraction in 2004 by DrOttelin  Family History: Last updated: 05/02/2009 both parents are living and healthy mother has hypertension and diabetes  Social History: Last updated: 07/08/2010 social alcohol  works as Marine scientist for [employer] never smoker single no children  Risk Factors: Smoking Status: never (01/07/2011)  Review of Systems      See HPI  Vital Signs:  Patient profile:   60 year old male Height:      70 inches Weight:      234 pounds BMI:     33.70 O2 Sat:      96 % on Room air Temp:     98.2 degrees F oral Pulse rate:   82 / minute BP sitting:   108 / 78  (right arm) Cuff size:   large  Vitals Entered By: Zackery Barefoot CMA (January 07, 2011 11:47 AM)  O2 Flow:  Room air CC: Pt c/o being unsteady on feet with nausea with sudden movements since Feb 15. Is Patient Diabetic? No Comments Medications reviewed with patient Verified contact number and pharmacy with patient Zackery Barefoot CMA  January 07, 2011 11:48 AM    Physical Exam  Additional Exam:  WD, WN, 60 y/o WM in NAD... GENERAL:  Alert & oriented; pleasant & cooperative... HEENT:  [place]/AT, EOM-wnl, PERRLA, Fundi-benign, EACs-clear TMs-wnl, NOSE-pale w/ clear discharge nontender sinus , THROAT-clear & wnl.   NECK:  Supple w/ full ROM; no JVD; normal carotid impulses w/o bruits; no thyromegaly or nodules palpated; no lymphadenopathy. CHEST:  Clear to P & A; without wheezes/ rales/ or rhonchi. HEART:  Regular Rhythm; without murmurs/ rubs/ or gallops. EXT: normal, no edema.  Neuro: CN 2-12 intact w/ no focal deficits noted, MAEW x 4 , norm gait. head maneuvrs without nystagmus but with reproducible symptoms.  nml grips, no tremors      Impression & Recommendations:  Problem # 1:  BENIGN PAROXYSMAL POSITIONAL VERTIGO  (ICD-386.11)  Vertigo-slow to resolve flare Neuro exam unrevealing  Plan:  needs to be out of work for few days to [employer] Meclizine 25mg  1/2 -1 three times a day as needed for dizziness Increase fluids  Rest Change positions slowly Advance activity as tolerated.  Please contact office for sooner follow up if symptoms do not improve or worsen   Orders: Est. Patient Level IV ([address])  Medications Added to Medication List This Visit: 1)  Antivert 25 Mg Tabs (Meclizine hcl) .... 1/2- 1 by mouth three times a day as needed dizziness  Patient Instructions: 1)  Meclizine 25mg  1/2 -1 three times a day as needed for dizziness 2)  Increase fluids  3)  Rest 4)  Change positions slowly 5)  Advance activity as tolerated.  6)  Please contact office for sooner follow up if symptoms do not improve or worsen  Prescriptions: ANTIVERT 25 MG TABS (MECLIZINE HCL) 1/2- 1 by mouth three times a day as needed dizziness  #45 x 1   Entered and Authorized by:   Rubye Oaks NP   Signed  by:   Rubye Oaks NP on 01/07/2011   Method used:   Electronically to        [facility] [address] [phone]* (retail)       [address]       [place], [place]  [address]       Ph: [phone] or [phone]       Fax: [phone]   RxID:   [id number]

## 2011-01-14 NOTE — Progress Notes (Signed)
Summary: vertigo  Phone Note Call from Patient   Caller: steve Call For: nadel Summary of Call: still having vertigo wants to see dr Kriste Basque or Marquice Uddin can come anytime after 10:30am today call cell # [phone] Initial call taken by: Oneita Jolly,  January 07, 2011 9:05 AM  Follow-up for Phone Call        [facility] [name]  January 07, 2011 9:35 AM   Scheduled appt today @ 11:45 w/ Bren Borys in [facility].  Lehman Prom  January 07, 2011 10:11 AM

## 2011-01-14 NOTE — Letter (Signed)
Summary: Out of Work  [employer] [facility]  [address]   [place], [place] [address]   Phone: [phone]  Fax: [phone]    January 07, 2011   Employee:  Russell Bullock    To Whom It May Concern:   For Medical reasons, please excuse the above named employee from work for the following dates:  Start:   01/07/2011  End:   01/14/2011  If you need additional information, please feel free to contact our office.         Sincerely,     Rubye Oaks, NP

## 2011-03-06 ENCOUNTER — Other Ambulatory Visit: Payer: Self-pay | Admitting: Pulmonary Disease

## 2011-03-11 NOTE — Op Note (Signed)
Russell Bullock, Russell Bullock               ACCOUNT NO.:  [id number]   MEDICAL RECORD NO.:  [id number]          PATIENT TYPE:  AMB   LOCATION:  DAY                          FACILITY:  [facility]   PHYSICIAN:  Mark C. Vernie Ammons, M.D.  DATE OF BIRTH:  04/16/1951   DATE OF PROCEDURE:  11/11/2007  DATE OF DISCHARGE:                               OPERATIVE REPORT   PREOP DIAGNOSIS:  Left ureteral calculus.   POSTOP DIAGNOSES:  1. Left ureteral calculus.  2. Left ureteral stricture.   PROCEDURES:  1. Cystoscopy.  2. Left retrograde pyelogram with interpretation.  3. Left ureteroscopy.  4. Dilation of ureteral stricture.  5. Stone extraction.  6. Double-J stent placement.   SURGEON:  Ihor Gully, M.D.   ANESTHESIA:  General.   BLOOD LOSS:  None.   DRAINS:  A 6-French, 24 cm double-J stent in the left ureter (with  string).   SPECIMENS:  None.   COMPLICATIONS:  None.   INDICATIONS:  The patient is a 60 year old white male with a past  history of stones who developed severe left flank pain.  A CT scan  revealed a 2-mm stone in the distal ureter with mild dilation of the  ureter.  No stones were seen in either kidney.  The patient has had  intermittent severe renal colic necessitating emergency room visit and  was therefore scheduled for ureteroscopic extraction of the stone.  The  risks, complications and alternatives were discussed.  He understands  and has elected to proceed.   DESCRIPTION OF OPERATION:  After informed consent, the patient was  brought to the major OR and placed on the table and administered general  anesthesia and official time-out was performed.  His genitalia was  sterilely prepped and draped and initially a 22-French cystoscope was  passed per urethra under direct visualization. The urethra was noted be  normal, the sphincter intact and the prostatic urethra revealed mild  bilobar hypertrophy but no lesions.  The bladder was then entered and no  tumor, stones or  inflammatory lesions were identified.  Ureteral  orifices were of normal configuration and position.   A 6-French open-end ureteral catheter was then introduced into the left  ureteral orifice and left retrograde pyelogram was performed by  injecting full strength contrast through the open-ended stent up the  left ureter under direct fluoroscopic visualization.  This revealed a  normal-appearing ureter with what appeared to be an area of slight  narrowing.  There were no proximal lesions identified and the collecting  system was also noted be normal.   The cystoscope was removed and the 6-French rigid ureteroscope was then  passed under direct visualization, into the bladder and into the left  orifice without difficulty.  I then was able to gently negotiate this up  the left ureter under direct visualization until I reached an area where  I could not easily pass the scope any further.  I, therefore, passed a  0.038 inch floppy tip guidewire through the ureteroscope and was able to  negotiate this through the narrowed area and on up into the area of  the  kidney under direct fluoroscopy.  The area was narrowed enough that it  barely allowed  the guidewire passage.  I then backed the ureteroscope  off of the guidewire and left that in place and performed ureteral  dilation.   The 4 cm length 15-French ureteral dilating balloon was then passed over  the guidewire and under fluoroscopy, placed in the area that was noted  to be narrowed and inflated to 14 atmospheres.  This was allowed to  remain inflated for approximately 60 seconds and then deflated and  removed.   I then reinserted the ureteroscope over the guidewire and up the left  ureter.  I was able to get through the narrowed area and just beyond  that the stone was identified and photographed.  I removed the guidewire  and inserted a nitinol basket and was able to grasp the stone without  difficulty and extracted it completely.  I  then reinserted the  ureteroscope and made sure I was up visualizing the narrowed area and  then passed the guidewire through the ureteroscope and on up into the  renal pelvis under direct visualization and fluoroscopy.  The guidewire  was left in place and the ureteroscope was removed, the cystoscope was  then back loaded over the guidewire and the double-J stent was passed  through the cystoscope and as the guidewire was removed good curl was  noted in the renal pelvis and in the bladder.  The bladder was drained,  the cystoscope was removed and the string was left affixed on the stent  and affixed to the dorsum of the penis.  The patient was awakened and  taken to recovery in stable satisfactory condition.  He tolerated the  procedure well with no intraoperative complications.  His stent will  remain in place and will be removed next week.  He will be given a  prescription for Pyridium Plus #24 and Vicodin HP #24 as well.      Mark C. Vernie Ammons, M.D.  Electronically Signed     MCO/MEDQ  D:  11/11/2007  T:  11/11/2007  Job:  [id number]

## 2011-03-11 NOTE — Assessment & Plan Note (Signed)
[facility]                             PULMONARY OFFICE NOTE   SEARCY, MIYOSHI                      MRN:          [id number]  DATE:03/23/2007                            DOB:          Nov 18, 1950    HISTORY OF PRESENT ILLNESS:  The patient is 60 year old white male  patient of Dr.  Jodelle Green who has a known history of asthmatic bronchitis,  hyperlipidemia, and gastroesophageal reflux who presents today with a  one week history of nasal congestion, sinus pain and pressure,  productive cough with thick yellowish sputum, fever T-MAX of 101.0. The  patient denies any hemoptysis, orthopnea, PND or leg swelling. The  patient has been using over-the-counter products without much relief.   PAST MEDICAL HISTORY:  Reviewed.   CURRENT MEDICATIONS:  Reviewed.   PHYSICAL EXAMINATION:  The patient is a pleasant male in no acute  distress.  He is afebrile with stable vital signs.  HEENT: Nasal mucosa is erythematous with maxillary sinus tenderness.  Posterior pharynx is clear.  NECK: Supple without adenopathy.  LUNGS: Lung sounds are clear.  CARDIAC: Regular rate.  ABDOMEN: Soft and nontender.  EXTREMITIES: Warm without edema.   IMPRESSION/PLAN:  Acute tracheal bronchitis and probable early  sinusitis. The patient is to begin Omnicef x10 days, Mucinex DM twice a  day, nasal hygiene regimen reviewed, prednisone taper over the next week  and Tussionex one teaspoon every 12 hours as needed for cough. The  patient is to return back with Dr.  Kriste Basque as scheduled.      Rubye Oaks, NP  Electronically Signed      [name]. Kriste Basque, MD  Electronically Signed   TP/MedQ  DD: 03/23/2007  DT: 03/23/2007  Job #: [id number]

## 2011-03-11 NOTE — Assessment & Plan Note (Signed)
[facility]                             PULMONARY OFFICE NOTE   Russell Bullock, Russell Bullock                      MRN:          [id number]  DATE:06/01/2007                            DOB:          02/15/1951    HISTORY OF PRESENT ILLNESS:  The patient is a 60 year old white male, a  patient of Dr. [name]. Nadel's, who has a known history of  hyperlipidemia, gastroesophageal reflux and asthmatic bronchitis.  He  presents today for an acute office visit.  The patient complains that he  developed some eye redness and discharge yesterday and was seen by his  ophthalmologist and diagnosed with conjunctivitis.  He was started on  antibiotic eye drops; however, this morning woke up with a rash along  his right eye and forehead.  The patient was seen by his ophthalmologist  again this morning and referred over to our office for suspected  shingles outbreak.  The patient complained of increased sensitivity and  pain along the right side of his face.  He has had some intermittent  episodes of nausea and vomiting x2.  The patient complains that the area  feels somewhat painful to touch and very sensitive.  The patient denied  any chest pain, palpitations, fever, neck pain, recent travel or  antibiotic use.  The patient denies any eye injury, decreased vision or  previous shingles outbreak.  The patient does believe he had chickenpox  as a child.   PAST MEDICAL HISTORY:  1. Gastroesophageal reflux disease.  2. Hyperlipidemia.  3. Kidney stones.  4. Allergic rhinitis.   CURRENT MEDICATIONS:  1. Aspirin 81 mg daily.  2. Rhinocort Aqua daily.  3. Simvastatin 40 mg q.h.s.  4. Ambien p.r.n.  5. Indomethacin p.r.n.  6. Zantac p.r.n.   PHYSICAL EXAMINATION:  GENERAL:  The patient is in no acute distress.  VITAL SIGNS:  He is afebrile with stable vital signs.  O2 saturation is  98% on room air.  HEENT:  Left conjunctiva not injected.  Right conjunctiva slightly  injected.  Upper lids eye margin with some slight swelling and redness.  The patient has multiple scattered vesicular lesions along the right  side of forehead and scalp.  Posterior pharynx clear.  Nasopharynx is  pink and moist.  NECK:  Supple without cervical adenopathy.  No jugular venous  distention.  Negative nuchal rigidity.  LUNGS:  Sounds are clear.  HEART:  A regular rate and rhythm.  ABDOMEN:  Soft, nontender.  No palpable hepatosplenomegaly.  EXTREMITIES:  Warm without any clubbing, cyanosis or edema.   IMPRESSION/PLAN:  Right-sided herpes zoster with right eye involvement.  The patient will begin Valtrex 1 gram three times daily x7 days, a  prednisone taper over the next week, Vicodin for severe pain.  The  patient may also use Phenergan 25 mg q.4-6h. p.r.n. severe pain.  The  patient is aware of the sedating effect.  The patient will continue on  his prescribed eye drops per his ophthalmologist.   FOLLOWUP:  1. He will return here in three days for followup.  The patient is  to      contact our office for sooner followup if symptoms do not improve      or worsen, and the patient is advised if he develops fever, neck      pain, shortness of breath or worsening symptoms.  He is to contact      our office sooner, or seek emergency attention, or if he has visual      disturbances.  2. Also will follow back up with his ophthalmologist in three days as      scheduled.      Rubye Oaks, NP  Electronically Signed      [name]. Kriste Basque, MD  Electronically Signed   TP/MedQ  DD: 06/01/2007  DT: 06/01/2007  Job #: [id number]

## 2011-03-11 NOTE — Assessment & Plan Note (Signed)
[facility]                             PULMONARY OFFICE NOTE   OLIN, GURSKI                      MRN:          [id number]  DATE:03/08/2007                            DOB:          Jan 20, 1951    HISTORY OF PRESENT ILLNESS:  The patient is a 60 year old white male  patient of Dr. Kriste Basque, who has a known history of asthmatic bronchitis,  hyperlipidemia and gastroesophageal reflux, who presents today  complaining over the last 2 weeks that he has had increased episodes of  heartburn with abdominal bloating and decreased appetite, especially  after eating or drinking.  The patient denies any bloody stools, fever,  urinary symptoms, recent travel or antibiotics use.  The patient does  complain that he has been feeling fatigued and tired over the last few  weeks.  The patient was recently started on verapamil by May, ENT at  [facility] for patient's vertigo.  The patient does  complain that he has been using Rhinocort and has noticed that he had  some feeling as if something is in the back of his throat recently, in  fact 1 week ago coughed up something and had some blood-tinged sputum.   PAST MEDICAL HISTORY:  Reviewed.   CURRENT MEDICATIONS:  Reviewed.   PHYSICAL EXAMINATION:  GENERAL:  The patient is a pleasant male in no  acute distress.  VITAL SIGNS:  Temperature is 99.1, blood pressure 142/98, O2 saturation  96% on room air.  Weight is down 11 pounds at 213.  HEENT:  Sclerae are nonicteric.  Posterior nasal mucosa is erythematous.  Nontender sinuses.  Posterior pharynx is clear without any exudate.  NECK:  Supple without cervical adenopathy.  No JVD.  LUNGS:  Lung sounds are clear to auscultation bilaterally.  CARDIAC:  S1, S2, without murmur, rub, or gallop.  ABDOMEN:  Soft with positive throughout all 4 quadrants.  No guarding or  rebound.  No palpable hepatosplenomegaly.  Negative for CVA tenderness.  EXTREMITIES:  Warm  without any calf cyanosis, clubbing or edema.  SKIN:  Warm without rash.   IMPRESSION AND PLAN:  1. Abdominal discomfort, suspected secondary to reflux.  The patient      is advised on reflux preventative measures.  Will begin Aciphex 20      mg daily before breakfast.  He will return here in 1 month with Dr.      Kriste Basque or sooner if needed.  2. Fatigue, questionable etiology.  Labs are pending at time of      dictation including a CBC, CMET and TSH.  Symptoms possibly could      be due to the new addition of verapamil; however, will continue      with this prescription until lab work is returned, and he will      follow back up with Dr. Kriste Basque in 1 month as scheduled.  3. Suspected rhinitis flare.  The patient is to add in saline nasal      spray to his present regimen.  The patient may also use some  Claritin or Zyrtec over-the-counter as needed.  Suspect the      patient's blood-tinged sputum was secondary to nasal drainage;      however, will check a chest x-ray today and that is currently      pending at the time of dictation.      Rubye Oaks, NP       [name]. Kriste Basque, MD    TP/MedQ  DD: 03/08/2007  DT: 03/08/2007  Job #: [id number]

## 2011-03-14 NOTE — H&P (Signed)
Russell Bullock, Russell Bullock               ACCOUNT NO.:  [id number]   MEDICAL RECORD NO.:  [id number]          PATIENT TYPE:  OIB   LOCATION:  2859                         FACILITY:  [facility]   PHYSICIAN:  Sanjeev K. Deveshwar, M.D.DATE OF BIRTH:  01/13/1951   DATE OF ADMISSION:  06/02/2005  DATE OF DISCHARGE:                                HISTORY & PHYSICAL   CHIEF COMPLAINT:  The patient is here for a cerebral angiogram today.   HISTORY OF PRESENT ILLNESS:  This is a 60 year old male who was recently  referred to Dr. Orlin Hilding from Dr. Kriste Basque for evaluation of intermittent  headaches.  The patient has noted headaches associated with increased  intracranial pressure.  The headaches have been described as a severe sharp  sudden pain in his left temple area.  These have been associated with  activity such as coughing, sneezing, squatting and then standing and other  types of exertion.  Occasionally he will have severe headaches associated  with nausea, vomiting and blurred vision.   The patient saw Dr. Orlin Hilding on May 12, 2005.  An MRI/MRA was ordered.  This  was performed on May 20, 2005.  There was a question of an area of  decreased flow in the right vertebral artery as well as a question of a  focal stenosis in the right proximal posterior communicating artery.  The  patient has been referred for a cerebral angiogram to be performed by Dr.  Corliss Skains to further evaluate these areas.   PAST MEDICAL HISTORY:  Renal calculi.  The patient has had previous surgery  as well as lithotripsy performed in the past.  He has mildly elevated  cholesterol levels.  He had episodes of facial numbness approximately three  years ago and was evaluated by Dr. Orlin Hilding.  At that time she felt that he  may be having TIA's.   ALLERGIES:  CONTACT COLD CAPSULES WHICH APPARENTLY CAUSE SWELLING.   CURRENT MEDICATIONS:  1.  Aspirin 81 mg daily.  2.  Vytorin 10/40 one each evening.   SOCIAL HISTORY:  The patient  is single.  He has no children.  He does not  use tobacco.  He drinks alcohol socially.  He works as a Marine scientist  for [employer].   FAMILY HISTORY:  Both parents are living and in their 77s.  His mother has  hypertension and diabetes.  His father is alive and well.   REVIEW OF SYSTEMS:  Is negative except for the above-noted headaches and  occasional indigestion.   PHYSICAL EXAMINATION:  GENERAL:  Reveals a pleasant 60 year old white male  in no acute distress.  VITAL SIGNS:  Pending.  HEENT:  Unremarkable.  NECK:  Reveals no bruits.  No jugular venous distention.  HEART:  Regular rate and rhythm without murmur.  LUNGS:  Clear.  ABDOMEN:  Soft, nontender.  EXTREMITIES:  Reveals pulses to be intact without edema.  SKIN:  Warm and dry.  NEUROLOGIC:  The patient is alert and oriented and follows commands.  Cranial nerves II-XII are grossly intact.  Sensation is intact to light  touch.  Motor  strength is 5/5 throughout.  Cerebellar testing is intact.  Air way was rated at a one.  ASA scale was rated as a one.   IMPRESSION:  1.  Recent intermittent headaches related to increased intracranial      pressure.  2.  Abnormal MRA as noted above.  3.  History of hyperlipidemia.  4.  Remote history of facial weakness, question transient ischemic attack.  5.  Gastroesophageal reflux disease.  6.  History of renal calculi.   PLAN:  The patient will undergo cerebral angiogram today to further evaluate  for cerebrovascular disease.      Markus.Osmond   DR/MEDQ  D:  06/02/2005  T:  06/02/2005  Job:  [id number]   cc:   Santina Evans A. Orlin Hilding, M.D.  Fax: [phone]   [name]. Kriste Basque, M.D. [facility]

## 2011-03-14 NOTE — Op Note (Signed)
NAME:  Russell Bullock, Russell Bullock                         ACCOUNT NO.:  [id number]   MEDICAL RECORD NO.:  [id number]                   PATIENT TYPE:  AMB   LOCATION:  [facility]                                 FACILITY:  [facility]   PHYSICIAN:  Mark C. Vernie Ammons, M.D.               DATE OF BIRTH:  1951/06/24   DATE OF PROCEDURE:  08/25/2003  DATE OF DISCHARGE:                                 OPERATIVE REPORT   PREOPERATIVE DIAGNOSIS:  Right ureteral calculus with obstruction.   POSTOPERATIVE DIAGNOSIS:  Right ureteral calculus with obstruction.   PROCEDURES:  1. Cystoscopy.  2. Right retrograde pyelogram with interpretation.  3. Right ureteroscopy with stone extraction.  4. Double J stent placement.   SURGEON:  Mark C. Vernie Ammons, M.D.   ANESTHESIA:  General.   ESTIMATED BLOOD LOSS:  Less than 5 mL.   DRAINS:  4.7 French, 26 cm double J stent in the right ureter with string.   SPECIMENS:  Stone, given to patient.   COMPLICATIONS:  None.   INDICATIONS:  The patient is a 60 year old white male with a history of  kidney stones.  He experienced right flank pain earlier this month and a CT  scan was obtained that revealed a 4-5 mm right proximal ureteral calculus  with hydronephrosis and hydroureter.  We discussed the treatment options and  I initially recommended lithotripsy due to the stone's location, but his  insurance did not cover that and asked if I could perform ureteroscopy.  He  has undergone this before and understands the risks, complications, and  alternatives, and has elected to proceed with that form of therapy.   DESCRIPTION OF OPERATION:  After informed consent, patient brought to the  major OR and placed on the table and administered general anesthesia in the  dorsal lithotomy position.  His genitalia was sterilely prepped and draped.  The meatus was slightly snug and a manifold dilator with [employer] sounds to  26 [place].  The 22 French cystoscope was then introduced.  The bladder  and  urethra were normal, the prostatic urethra was without lesions, the bladder  lined with normal pink mucosa and free of any tumors, stones, or  inflammatory lesions.  The right ureteral orifice was then identified and a  5 [place] open-ended ureteral catheter was passed into the orifice and a  right retrograde pyelogram performed.  This revealed normal-appearing ureter  up to what appeared to be a filling defect and change in caliber of the  ureter with mild hydronephrosis proximally consistent with his stone.   The ureteral catheter and cystoscope were removed.  I noted that his  ureteral orifice appeared rather patulous, and therefore I passed a 6 French  rigid ureteroscope directly under visualization into the bladder and into  the right ureteral orifice without difficulty.  I was able to pass the scope  up to the stone, visualized it, photographed it, and grasped  it with a  nitinol basket.  I oriented the stone along the long axis of the ureter and  was easily able to withdrawn the stone without difficulty along with the  ureteroscope.  I then reinserted the ureteroscope and passed it up the  ureter and noted no perforation or significant injury.  I passed the scope  up to the location where the stone was previously located.  I then passed  the guidewire through the ureteroscope into the renal pelvis under  fluoroscopic control and removed the ureteroscope, leaving the guidewire in  place.  The cystoscope was then back loaded over the guidewire and I then  passed the double J stent over the guidewire, up the right ureter, and into  the renal pelvis with good curl being noted as the guidewire was removed.  A  good curl was noted in the bladder as well.  The bladder was then drained  and the string left affixed to the stent.  The patient received 2% lidocaine  jelly per urethra and a B&O suppository and was transferred to the recovery  room in stable, satisfactory condition.  There  were no intraoperative  complications.  He will be given a prescription for 36 Vicodin ES and 30  Pyridium Plus and will return to my office in six to seven days to have the  stent removed.                                               Mark C. Vernie Ammons, M.D.    MCO/MEDQ  D:  08/25/2003  T:  08/25/2003  Job:  [id number]

## 2011-03-14 NOTE — Assessment & Plan Note (Signed)
 HEALTHCARE                             PULMONARY OFFICE NOTE   Russell Bullock                      MRN:          [id number]  DATE:01/11/2007                            DOB:          Jan 02, 1951    HISTORY OF PRESENT ILLNESS:  The patient is a 60 year old white male  patient of Dr. Jodelle Green who has a known history of asthmatic bronchitis,  hyperlipidemia and gastroesophageal reflux, who presents for an acute  office visit.  The patient complains over the last 4 days that he has  noticed a painful nodule along the palm of his right hand.  He denies  any known injury, chest pain, shortness of breath or extremity weakness.   PAST MEDICAL HISTORY:  Reviewed.   CURRENT MEDICATIONS:  Reviewed.   PHYSICAL EXAMINATION:  The patient is a pleasant male in no acute  distress.  He is afebrile with stable vital signs.  HEENT:  Unremarkable.  LUNGS:  Sounds are clear.  CARDIAC:  Regular rate.  ABDOMEN:  Soft and nontender.  EXTREMITIES:  Warm without any edema.  Right hand exam reveals a small  tender palpable nodule along the base of the 4th digit.  No redness is  noted.  Negative Phalen's sign.  Pulses are intact.  Range of motion of  hand and digit range of motion are normal.  No joint swelling is noted.   IMPRESSION AND PLAN:  1. Painful nodule on the right hand at the base of the fourth digit,      questionable etiology, possibly some inflammation versus cyst.  The      patient is advised to begin Advil 400 mg b.i.d. for 5-7 days, ice      p.r.n.  If symptoms do not resolve within the next 1-2 weeks, will      need referral to orthopedist/hand specialist.      Russell Oaks, NP  Electronically Signed      Russell Bullock. Russell Basque, MD  Electronically Signed   TP/MedQ  DD: 01/11/2007  DT: 01/12/2007  Job #: [id number]

## 2011-03-14 NOTE — Op Note (Signed)
NAME:  Russell Bullock, Russell Bullock                         ACCOUNT NO.:  [id number]   MEDICAL RECORD NO.:  [id number]                   PATIENT TYPE:  AMB   LOCATION:  [facility]                                 FACILITY:  [facility]   PHYSICIAN:  Mark C. Vernie Ammons, M.D.               DATE OF BIRTH:  24-Apr-1951   DATE OF PROCEDURE:  07/22/2002  DATE OF DISCHARGE:                                 OPERATIVE REPORT   PREOPERATIVE DIAGNOSES:  Right ureteral calculus.   POSTOPERATIVE DIAGNOSES:  Right ureteral calculus.   PROCEDURE:  Cystoscopy, right retrograde pyelogram with interpretation,  right ureteroscopic stone extraction and double J stent placement.   SURGEON:  Mark C. Vernie Ammons, M.D.   ANESTHESIA:  General.   DRAINS:  4.7 French, 26 cm double J stent in the right ureter.   ESTIMATED BLOOD LOSS:  Less than 5 cc.   SPECIMENS:  None.   COMPLICATIONS:  None.   INDICATIONS FOR PROCEDURE:  The patient is a 60 year old white male with  intermittent right renal colic. He was found on IVP to have obstruction and  a small stone in the mid ureter on the right hand side. It is difficult to  see on plain film. He has not passed the stone and has continued to have  pain even up until this morning. He is brought to the OR for ureteroscopic  extraction since the stone was given time to pass but did not do so.   DESCRIPTION OF PROCEDURE:  After informed consent, the patient was brought  to the major OR, placed on the table, administered general anesthesia and  moved to the dorsal lithotomy position. His genitalia was sterilely prepped  and draped and the 21 French cystoscope with the 12 degree lens was  inserted, the urethra was noted to be normal down to the sphincter which was  intact, the prostatic urethra was unremarkable. The bladder itself had no  tumor, stones or inflammatory lesions. The right ureteral orifice was  identified and an open ended rigid ureteral catheter was placed in the  orifice and  right retrograde pyelogram was performed. It revealed slight  ureterectasis proximal to an area just below where the ureter crosses the  sacrum. This corresponds to a faint density seen on his preop KUB.   A 0.038 inch floppy tip guidewire was then passed through the open ended  catheter and up the ureter under fluoroscopic control. The cystoscope and  the ureteral catheter were removed leaving the guidewire in place and the  ureteroscope was then used.   A 6 French rigid short ureteroscope was then passed per urethra and into the  right ureteral orifice. I then removed the guidewire once the scope was in  the ureter and passed the scope on up to the stone which was visualized and  photographed. I then grasped the stone with the nitinol basket and removed  the stone and scope.  The stone broke up as I was removing it once outside of  the patient.   I then reinserted the cystoscope and passed the guidewire up the into the  area of the right kidney under fluoroscopic control. I then passed the  double J stent over the guidewire and noted good curl of the stent in the  area of the renal pelvis. The renal pelvis was fairly small though did not  offer a lot of space for the stent to curl. Once the guidewire was removed,  there was good curl noted in the bladder. I then drained the bladder,  removed the cystoscope and the patient was given a B&O suppository. He was  awakened and taken to the recovery room in stable and satisfactory  condition. He tolerated the procedure well and there were no intraoperative  complications.   I left a string attached to the stent and this will be removed in my office  next week. He was given 28 Vicodin ES and 28 Pyridium Plus.                                               Mark C. Vernie Ammons, M.D.    MCO/MEDQ  D:  07/22/2002  T:  07/22/2002  Job:  [id number]

## 2011-07-01 ENCOUNTER — Telehealth: Payer: Self-pay | Admitting: Adult Health

## 2011-07-01 MED ORDER — METHOCARBAMOL 500 MG PO TABS: 500.0000 mg | ORAL_TABLET | Freq: Three times a day (TID) | ORAL | Status: AC | PRN

## 2011-07-01 NOTE — Telephone Encounter (Signed)
rx was sent to pharm and pt aware.

## 2011-07-01 NOTE — Telephone Encounter (Signed)
Per SN---ok to give pt robaxin 500mg    1 po tid prn muscle spasms.  #90 with 1 refill.  thanks

## 2011-07-01 NOTE — Telephone Encounter (Signed)
Called, spoke with pt.  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  Having dull pain and still feels like something is catching when moving in the small of his back and to the right hip.  Taking aleve with no relief and using ice packs with some relief.  Requesting something to be called in for this - ? Muscle relaxer.  [facility].  Allergies - Contact Cold Caps.  Dr. Kriste Basque, pls advise.  Thanks!

## 2011-07-07 ENCOUNTER — Telehealth: Payer: Self-pay | Admitting: Pulmonary Disease

## 2011-07-07 MED ORDER — AZITHROMYCIN 250 MG PO TABS: ORAL_TABLET | ORAL | Status: AC

## 2011-07-07 NOTE — Telephone Encounter (Signed)
Called and spoke with pt and per SN---ok for zpak.  Pt is aware that the med has been sent to the pharmacy

## 2011-07-16 LAB — URINALYSIS, ROUTINE W REFLEX MICROSCOPIC
Glucose, UA: NEGATIVE
Ketones, ur: NEGATIVE
Leukocytes, UA: NEGATIVE
Protein, ur: NEGATIVE

## 2011-07-16 LAB — URINE CULTURE
Colony Count: NO GROWTH
Culture: NO GROWTH

## 2011-07-16 LAB — URINE MICROSCOPIC-ADD ON

## 2011-07-17 LAB — HEMOGLOBIN AND HEMATOCRIT, BLOOD
HCT: 41.2
Hemoglobin: 14.5

## 2011-07-29 ENCOUNTER — Other Ambulatory Visit: Payer: Self-pay | Admitting: Pulmonary Disease

## 2011-10-16 ENCOUNTER — Other Ambulatory Visit: Payer: Self-pay | Admitting: Pulmonary Disease

## 2011-11-03 ENCOUNTER — Other Ambulatory Visit: Payer: Self-pay | Admitting: Pulmonary Disease

## 2011-11-06 ENCOUNTER — Other Ambulatory Visit: Payer: Self-pay | Admitting: Pulmonary Disease

## 2011-11-07 NOTE — Telephone Encounter (Signed)
Ok to refill? Please advise. Thanks Huntley Dec

## 2011-12-04 ENCOUNTER — Other Ambulatory Visit: Payer: Self-pay | Admitting: *Deleted

## 2011-12-04 MED ORDER — VITAMIN D (ERGOCALCIFEROL) 1.25 MG (50000 UNIT) PO CAPS: 50000.0000 [IU] | ORAL_CAPSULE | ORAL | Status: DC

## 2011-12-04 MED ORDER — FLUTICASONE PROPIONATE 50 MCG/ACT NA SUSP: 2.0000 | Freq: Every day | NASAL | Status: DC

## 2011-12-04 MED ORDER — SIMVASTATIN 40 MG PO TABS: 40.0000 mg | ORAL_TABLET | Freq: Every day | ORAL | Status: DC

## 2011-12-22 ENCOUNTER — Telehealth: Payer: Self-pay | Admitting: Pulmonary Disease

## 2011-12-22 NOTE — Telephone Encounter (Signed)
C/o being unsteady on feet, "room spinning", nausea with movement. States has been laying down today c/o worse when eyes are closed starting 0330hrs. Has Valuim 2mg  on hand and has not taken same. Has taken Meclizine 25mg  twice today 1 this AM and a 1/2 eight hours later. Is only drinking Gatorade. Denies fever. Had flu x 3 weeks ago. Dr. Kriste Basque please advise, thanks.   [facility]

## 2011-12-22 NOTE — Telephone Encounter (Signed)
Per SN: fluids and rest, take valuim up to TID nad his meclizine up to 4 times a day in between.   I spoke with pt and he voiced his understanding of this and needed nothing further

## 2012-02-09 ENCOUNTER — Encounter: Payer: Self-pay | Admitting: Adult Health

## 2012-02-10 ENCOUNTER — Encounter: Payer: Self-pay | Admitting: Adult Health

## 2012-02-10 ENCOUNTER — Ambulatory Visit (INDEPENDENT_AMBULATORY_CARE_PROVIDER_SITE_OTHER): Payer: Managed Care, Other (non HMO) | Admitting: Adult Health

## 2012-02-10 VITALS — BP 122/82 | HR 98 | Temp 98.0°F | Ht 71.0 in | Wt 230.0 lb

## 2012-02-10 DIAGNOSIS — D229 Melanocytic nevi, unspecified: Secondary | ICD-10-CM

## 2012-02-10 DIAGNOSIS — D239 Other benign neoplasm of skin, unspecified: Secondary | ICD-10-CM

## 2012-02-10 NOTE — Patient Instructions (Signed)
Need to set up office visit with Dr. Terri Piedra for mole check.  follow up in 2 weeks for physical as planned with fasting labs.

## 2012-02-12 DIAGNOSIS — D229 Melanocytic nevi, unspecified: Secondary | ICD-10-CM | POA: Insufficient documentation

## 2012-02-12 NOTE — Progress Notes (Signed)
  Subjective:    Patient ID: Russell Bullock, male    DOB: [birth year], 61 y.o.   MRN: [id number]  HPI This is a 61 year old male patient with a known history of irritable bowel syndrome, gastroesophageal reflux disease, kidney stones, and hyperlipidemia.  02/10/12 Acute Office visit.  Patient presents to the office today after noticing a mole on the right upper thigh. Patient feels that this has changed in color and may be slightly larger. Also, noticed it had several different colors towards the middle part, that is different from what he remembers in the past. He denies any chest pain, shortness of breath, weight loss, rash, abdominal pain, or nausea, vomiting. He has an upcoming appointment for his routine physical in approximately 2 weeks with fasting labs. Overall, he says that he has been doing well. We have not seen him in greater than 1 year. 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 Terri Piedra, we have discussed a referral for a evaluation and general mole check.   Review of Systems Constitutional:   No  weight loss, night sweats,  Fevers, chills, fatigue, or  lassitude.  HEENT:   No headaches,  Difficulty swallowing,  Tooth/dental problems, or  Sore throat,                No sneezing, itching, ear ache, nasal congestion, post nasal drip,   CV:  No chest pain,  Orthopnea, PND, swelling in lower extremities, anasarca, dizziness, palpitations, syncope.   GI  No heartburn, indigestion, abdominal pain, nausea, vomiting, diarrhea, change in bowel habits, loss of appetite, bloody stools.   Resp: No shortness of breath with exertion or at rest.  No excess mucus, no productive cough,  No non-productive cough,  No coughing up of blood.  No change in color of mucus.  No wheezing.  No chest wall deformity  Skin: no rash    GU: no dysuria, change in color of urine, no urgency or frequency.  No flank pain, no hematuria   MS:  No joint pain or swelling.   No decreased range of motion.  No back pain.  Psych:  No change in mood or affect. No depression or anxiety.  No memory loss.         Objective:   Physical Exam GEN: A/Ox3; pleasant , NAD, well nourished   HEENT:  [place]/AT,  EACs-clear, TMs-wnl, NOSE-clear, THROAT-clear, no lesions, no postnasal drip or exudate noted.   NECK:  Supple w/ fair ROM; no JVD; normal carotid impulses w/o bruits; no thyromegaly or nodules palpated; no lymphadenopathy.  RESP  Clear  P & A; w/o, wheezes/ rales/ or rhonchi.no accessory muscle use, no dullness to percussion  CARD:  RRR, no m/r/g  , no peripheral edema, pulses intact, no cyanosis or clubbing.  GI:   Soft & nt; nml bowel sounds; no organomegaly or masses detected.  Musco: Warm bil, no deformities or joint swelling noted.   Neuro: alert, no focal deficits noted.    Skin: Warm Along the upper medial /inner thigh , small nevus ~0.25cm circular minimally raised w/ hyperpigmentation noted.          Assessment & Plan:

## 2012-02-12 NOTE — Assessment & Plan Note (Signed)
Refer to dermatology for mole evaluation  Patient was advised on good skin care with appropriate sunscreen use.

## 2012-02-19 ENCOUNTER — Other Ambulatory Visit: Payer: Self-pay | Admitting: Pulmonary Disease

## 2012-02-19 ENCOUNTER — Telehealth: Payer: Self-pay | Admitting: Pulmonary Disease

## 2012-02-19 DIAGNOSIS — Z0000 Encounter for general adult medical examination without abnormal findings: Secondary | ICD-10-CM

## 2012-02-19 NOTE — Telephone Encounter (Signed)
Pt aware labs are in the computer for tomorrow.  He verbalized understanding of this and voiced no further questions/concerns at this time.

## 2012-02-19 NOTE — Telephone Encounter (Signed)
Please let pt know that labs are in the computer for him for tomorrow.  thanks

## 2012-02-19 NOTE — Telephone Encounter (Signed)
Spoke with pt and he is requesting to come in and have labs done prior to his cpx next wk. Wants to come in tomorrow am. Please advise, thanks!

## 2012-02-19 NOTE — Telephone Encounter (Signed)
I called the pt back, when he answered he was having conversation with someone else that lasted approx 20 secs. I hung up and will try back later.

## 2012-02-20 ENCOUNTER — Other Ambulatory Visit (INDEPENDENT_AMBULATORY_CARE_PROVIDER_SITE_OTHER): Payer: Managed Care, Other (non HMO)

## 2012-02-20 DIAGNOSIS — Z0000 Encounter for general adult medical examination without abnormal findings: Secondary | ICD-10-CM

## 2012-02-20 LAB — LIPID PANEL
HDL: 45.1 mg/dL (ref >39.00)
Total CHOL/HDL Ratio: 4
Triglycerides: 143 mg/dL (ref 0.0–149.0)
VLDL: 28.6 mg/dL (ref 0.0–40.0)

## 2012-02-20 LAB — CBC WITH DIFFERENTIAL/PLATELET
Basophils Relative: 0.4 % (ref 0.0–3.0)
Eosinophils Relative: 1.4 % (ref 0.0–5.0)
HCT: 41.1 % (ref 39.0–52.0)
Hemoglobin: 13.8 g/dL (ref 13.0–17.0)
Lymphocytes Relative: 43.7 % (ref 12.0–46.0)
Monocytes Relative: 8.7 % (ref 3.0–12.0)
Neutro Abs: 2.9 10*3/uL (ref 1.4–7.7)
RBC: 4.71 Mil/uL (ref 4.22–5.81)

## 2012-02-20 LAB — BASIC METABOLIC PANEL
CO2: 29 mEq/L (ref 19–32)
Calcium: 9.3 mg/dL (ref 8.4–10.5)
Creatinine, Ser: 1 mg/dL (ref 0.4–1.5)
GFR: 81.85 mL/min (ref >60.00)
Glucose, Bld: 103 mg/dL — ABNORMAL HIGH (ref 70–99)

## 2012-02-20 LAB — HEPATIC FUNCTION PANEL
Albumin: 4 g/dL (ref 3.5–5.2)
Total Protein: 6.9 g/dL (ref 6.0–8.3)

## 2012-02-20 LAB — PSA: PSA: 0.82 ng/mL (ref 0.10–4.00)

## 2012-02-25 ENCOUNTER — Ambulatory Visit (INDEPENDENT_AMBULATORY_CARE_PROVIDER_SITE_OTHER): Payer: Managed Care, Other (non HMO) | Admitting: Pulmonary Disease

## 2012-02-25 ENCOUNTER — Encounter: Payer: Self-pay | Admitting: Pulmonary Disease

## 2012-02-25 ENCOUNTER — Ambulatory Visit (INDEPENDENT_AMBULATORY_CARE_PROVIDER_SITE_OTHER)
Admission: RE | Admit: 2012-02-25 | Discharge: 2012-02-25 | Disposition: A | Discharge status: Home / Self Care | Payer: Managed Care, Other (non HMO) | Source: Ambulatory Visit | Attending: Pulmonary Disease | Admitting: Pulmonary Disease

## 2012-02-25 VITALS — BP 120/80 | HR 79 | Temp 97.5°F | Ht 70.0 in | Wt 236.8 lb

## 2012-02-25 DIAGNOSIS — K219 Gastro-esophageal reflux disease without esophagitis: Secondary | ICD-10-CM

## 2012-02-25 DIAGNOSIS — Z0000 Encounter for general adult medical examination without abnormal findings: Secondary | ICD-10-CM

## 2012-02-25 DIAGNOSIS — J309 Allergic rhinitis, unspecified: Secondary | ICD-10-CM

## 2012-02-25 DIAGNOSIS — K589 Irritable bowel syndrome without diarrhea: Secondary | ICD-10-CM

## 2012-02-25 DIAGNOSIS — E785 Hyperlipidemia, unspecified: Secondary | ICD-10-CM

## 2012-02-25 NOTE — Patient Instructions (Signed)
Today we updated your med list in our EPIC system...    Continue your current medications the same...  Today we did your follow up CXR & EKG...    We will call you w/ the results in a few days...    We gave you a copy of your fasting blood work...  Keep up the good work w/ your diet & nutrimedic system plan...  Call for any problems.Marland KitchenMarland Kitchen

## 2012-02-29 ENCOUNTER — Encounter: Payer: Self-pay | Admitting: Pulmonary Disease

## 2012-02-29 NOTE — Progress Notes (Signed)
Subjective:     Patient ID: Russell Bullock, male   DOB: 03/19/1951, 61 y.o.   MRN: [id number]  HPI 61 y/o WM here for a follow up...  he has multiple medical problems as noted below...   ~  July 08, 2010:  last seen by me 3/10 for CPX & f/u of his AR, AB, Hyperlipidemia, GERD, etc... presents today w/ 3wk hx RUQ pain- below right costal margin, tender, hurts to roll over at night but NOT tender to palp on ribs of lower chest wall- assoc nausea, no vomit, +bloating, no ch in bowels, no hx GB dis or prev eval... we discussed checking Sonar & Labs>    Otherw stable- takes Levsin, Zantac as needed;  breathing satis w/o cough, SOB, etc;  no CP, paklpit, etc;  uses Valium 2mg  as needed for vertigo...  ~  November 06, 2010:  Add-on for sinusitis c/o congestion, drainage, blowing yellow "crud" for 1 wk w/ cough & beige sputum... ?fever, +chills, body aches, some HA, fatigue,etc... taking OTC Tylenol only & feeling poorly asking for 3d off work due to systemic symptoms... we discussed Rx w/ Pred, Augmentin, Mucinex, etc...  ~  Feb 25, 2012:  31mo ROV & CPX> here for yearly check up & states he's been doing well, feeling well, no new complaints or concerns; he is excited about starting the Nutrisystem diet plan & knows he will be successful- already down 3# to 237#...  We reviewed his problems, meds, labs, etc> see below>> CXR 5/13 showed borderline heart size &tortuous Ao, sl hyperinflation, min degen spondylosis, clear & NAD.Marland KitchenMarland Kitchen EKG 5/13 showed NSR, rate74, WNL, NAD... LABS 4/13:  FLP- at goals on Simva40;  Chems- ok w/ BS=103;  CBC= wnl;  TSH=1.80;  VitD=40;  PSA=0.82   Problem List:    << PROBLEM LIST UPDATED 02/25/12 >>  ALLERGIC RHINITIS (ICD-477.9) & OTITIS MEDIA, LEFT (ICD-382.9) - uses OTC antihistamines + FLONASE... hx otitis media requiring ENT- w/ bilat tympanoplasties by DrCrossly in the past...  hx Vertigo w/ Rx Valium 2mg  in past- he had an extensive eval by DrRosen & [facility] DrMay w/ rec for  [facility]  for Otolith disease...  Hx of ASTHMATIC BRONCHITIS, ACUTE (ICD-466.0) - he is a non-smoker w/ hx of recurrent bronchitic infections... ~  CXR 3/10 showed heart at upper lim norm, clear lungs, question sm HH, NAD.Marland Kitchen. ~  CXR 5/13 showed borderline heart size &tortuous Ao, sl hyperinflation, min degen spondylosis, clear & NAD...  HYPERLIPIDEMIA (ICD-272.4) - on SIMVASTATIN 40mg /d... ~  FLP 3/10 on Simva40 showed TChol 145, TG 114, HDL 40, LDL 82... rec> continue same. ~  FLP 4/13 on Simva40 showed TChol 168, TG 143, HDL 45, LDL 94  GERD (ICD-530.81) - prev on Prilosec Prn, now uses OTC Ranitadine 150mg /d... ~  9/11: presented w/ 3wk hx RUQ pain> Labs all neg & Sonar- normal GB & ducts...  HEMORRHOIDS (ICD-455.6) - colonoscopy 8/01 by DrPatterson was neg x for some min int hems... ~  5/13: he is overdue for f/u colonoscopy by DrPatterson & we will refer...   Hx of RENAL CALCULUS (ICD-592.0) - he had a kidney stone on the left requiring cystoscopic stone extraction in 1997 by DrRDavis et al... ~  Abd sonar 9/11 showed norm GB & ducts, bilat mild renal parenchymal thinning; sm left renal cyst, otherw neg...  HEADACHE (ICD-784.0) - eval by DrWeymann in 7/06... 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 of decr flow in right vertebral & ?  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 subseq 4 vessel cerebral angiogram by DrTDeveshwar was totally WNL.Marland KitchenMarland Kitchen  Hx of HERPES ZOSTER (ICD-053.9) & POSTHERPETIC NEURALGIA (ICD-053.19) - seen 8/08 with right V1shingles- facial/eye/scalp herpes zoster, treated with Valtrex and Prednisone taper... seen by Optometrist and recommend to use suppressive anti-virals for 6 month with acyclovir- labs revealed a negative HIV test, and postive HSV I/II IgM titer and negative HSV 2 IgG Ab... he required Lyrica for the postherpetic neuralgia and it resolved.  VITAMIN D DEFICIENCY (ICD-268.9) - on Vit D 50,000 u weekly... ~  Labs 3/10 showed  Vit D level = 18... Pt  started vit D supplement ~  Labs 1/12 showed Vit D level = 23... Continue supplement ~  Labs 4/13 showed Vit D level = 40... He remains on 50K weekly...  Health Maintenance - takes ASA 81mg /d... ~  f/u colonoscopy is now overdue ==> refer to GI DrPatterson. ~  labs 4/13 showed PSA= 0.82;  on Viagra Prn... ~  Immunizations:  encouraged to get yearly Flu vaccine each autumn.   Past Surgical History  Procedure Date  . Incision and drainage / excision thyroglossal cyst 1954  . Bilate tympanomastoid surgery   . Cystoscopic left renal stone extraction 1997    Dr Monico Blitz  . Right ureteroscopic stone extraction 2004    Dr Vernie Ammons    Outpatient Encounter Prescriptions as of 02/25/2012  Medication Sig Dispense Refill  . aspirin 81 MG tablet Take 81 mg by mouth daily.      . diazepam (VALIUM) 2 MG tablet Take 2 mg by mouth daily as needed.       . fluticasone (FLONASE) 50 MCG/ACT nasal spray Place 2 sprays into the nose daily.  48 g  0  . ranitidine (ZANTAC) 150 MG tablet Take 150 mg by mouth at bedtime as needed.      . simvastatin (ZOCOR) 40 MG tablet Take 1 tablet (40 mg total) by mouth at bedtime.  90 tablet  3  . VIAGRA 100 MG tablet USE AS DIRECTED  10 tablet  3  . Vitamin D, Ergocalciferol, (DRISDOL) 50000 UNITS CAPS Take 1 capsule (50,000 Units total) by mouth every 7 (seven) days.  12 capsule  0    No Known Allergies   Current Medications, Allergies, Past Medical History, Past Surgical History, Family History, and Social History were reviewed in [employer] record.   Review of Systems         See HPI - all other systems neg except as noted...  The patient complains of hoarseness, dyspnea on exertion, and prolonged cough.  The patient denies anorexia, fever, weight loss, weight gain, vision loss, decreased hearing, chest pain, syncope, peripheral edema, headaches, hemoptysis, abdominal pain, melena, hematochezia, severe indigestion/heartburn, hematuria,  incontinence, muscle weakness, suspicious skin lesions, transient blindness, difficulty walking, depression, unusual weight change, abnormal bleeding, enlarged lymph nodes, and angioedema.     Objective:   Physical Exam    WD, WN, 61 y/o WM in NAD... GENERAL:  Alert & oriented; pleasant & cooperative... HEENT:  [place]/AT, EOM-wnl, PERRLA, Fundi-benign, EACs-Left ear tube in place, TMs-wnl,  NOSE-pale w/ clear discharge , THROAT-clear & wnl.   NECK:  Supple w/ full ROM; no JVD; normal carotid impulses w/o bruits; no thyromegaly or nodules palpated; no lymphadenopathy. CHEST:  Clear to P & A; without wheezes/ rales/ or rhonchi. HEART:  Regular Rhythm; without murmurs/ rubs/ or gallops. ABD: Soft, +RUQ tender on palp but costal margin  is NOT tender, norm BS, no organomegaly or masses palp... EXT: w/o deformities, mild arthritic changes, no VV, mild VI, no edema... NEURO:  CN's intact, no focal neuro deficits... DERM:  no lesions seen...  RADIOLOGY DATA:  Reviewed in the EPIC EMR & discussed w/ the patient...  LABORATORY DATA:  Reviewed in the EPIC EMR & discussed w/ the patient...   Assessment:     AR/ AB>  Stable thru the allergy season on OTC antihist + FLONASE...  Hyperlipid>  FLP looks good- at goals- on Simva40 + diet; needs low chol, low fat diet & get the wt down...  GERD>  Stable on the Ranitadine, he says; ok to continue same...  Hx Hemorrhoids>  He is overdue for colonoscopy & we will refer to DrPatterson...  Hx Kidney Stone>  Aware- no recurrent stones evident...  Headaches>  He uses OTC analgesics now just as needed...  Hx Shingles & post herpetic neuralgia>  This has resolved 7 he is back to baseline...  Hx Vit D deific>  He remains on Vit D 50K weekly...     Plan:     Patient's Medications  New Prescriptions   No medications on file  Previous Medications   ASPIRIN 81 MG TABLET    Take 81 mg by mouth daily.   DIAZEPAM (VALIUM) 2 MG TABLET    Take 2 mg by mouth  daily as needed.    FLUTICASONE (FLONASE) 50 MCG/ACT NASAL SPRAY    Place 2 sprays into the nose daily.   RANITIDINE (ZANTAC) 150 MG TABLET    Take 150 mg by mouth at bedtime as needed.   SIMVASTATIN (ZOCOR) 40 MG TABLET    Take 1 tablet (40 mg total) by mouth at bedtime.   VIAGRA 100 MG TABLET    USE AS DIRECTED   VITAMIN D, ERGOCALCIFEROL, (DRISDOL) 50000 UNITS CAPS    Take 1 capsule (50,000 Units total) by mouth every 7 (seven) days.  Modified Medications   No medications on file  Discontinued Medications   No medications on file

## 2012-03-05 ENCOUNTER — Encounter: Payer: Self-pay | Admitting: Gastroenterology

## 2012-03-12 ENCOUNTER — Ambulatory Visit (AMBULATORY_SURGERY_CENTER): Payer: Managed Care, Other (non HMO) | Admitting: *Deleted

## 2012-03-12 VITALS — Ht 70.0 in | Wt 232.5 lb

## 2012-03-12 DIAGNOSIS — Z1211 Encounter for screening for malignant neoplasm of colon: Secondary | ICD-10-CM

## 2012-03-12 MED ORDER — PEG-KCL-NACL-NASULF-NA ASC-C 100 G PO SOLR: ORAL | Status: DC

## 2012-03-21 ENCOUNTER — Other Ambulatory Visit: Payer: Self-pay | Admitting: Pulmonary Disease

## 2012-03-26 ENCOUNTER — Encounter: Payer: Self-pay | Admitting: Gastroenterology

## 2012-03-26 ENCOUNTER — Ambulatory Visit (AMBULATORY_SURGERY_CENTER): Payer: Managed Care, Other (non HMO) | Admitting: Gastroenterology

## 2012-03-26 VITALS — BP 162/95 | HR 91 | Temp 98.7°F | Resp 18 | Ht 70.0 in | Wt 232.0 lb

## 2012-03-26 DIAGNOSIS — K5730 Diverticulosis of large intestine without perforation or abscess without bleeding: Secondary | ICD-10-CM

## 2012-03-26 DIAGNOSIS — Z1211 Encounter for screening for malignant neoplasm of colon: Secondary | ICD-10-CM

## 2012-03-26 MED ORDER — SODIUM CHLORIDE 0.9 % IV SOLN: 500.0000 mL | INTRAVENOUS | Status: DC

## 2012-03-26 NOTE — Progress Notes (Signed)
Patient did not experience any of the following events: a burn prior to discharge; a fall within the facility; wrong site/side/patient/procedure/implant event; or a hospital transfer or hospital admission upon discharge from the facility. (G8907) Patient did not have preoperative order for IV antibiotic SSI prophylaxis. (G8918)  

## 2012-03-26 NOTE — Op Note (Signed)
[facility] [address] [place], [place]  [address]  COLONOSCOPY PROCEDURE REPORT  PATIENT:  Russell, Bullock  MR#:  [id number] BIRTHDATE:  1950-11-17, 60 yrs. old  GENDER:  male ENDOSCOPIST:  Vania Rea. Jarold Motto, MD, [facility] REF. BY: PROCEDURE DATE:  03/26/2012 PROCEDURE:  Average-risk screening colonoscopy G0121 ASA CLASS:  Class II INDICATIONS:  Routine Risk Screening MEDICATIONS:   propofol (Diprivan) 250 mg IV  DESCRIPTION OF PROCEDURE:   After the risks and benefits and of the procedure were explained, informed consent was obtained. Digital rectal exam was performed and revealed no abnormalities. The LB [id number] endoscope was introduced through the anus and advanced to the cecum, which was identified by both the appendix and ileocecal valve.  The quality of the prep was excellent, using MoviPrep.  The instrument was then slowly withdrawn as the colon was fully examined. <<PROCEDUREIMAGES>>  FINDINGS:  Moderate diverticulosis was found in the sigmoid to descending colon segments.  No polyps or cancers were seen.  This was otherwise a normal examination of the colon.   Retroflexed views in the rectum revealed no abnormalities.    The scope was then withdrawn from the patient and the procedure completed.  COMPLICATIONS:  None ENDOSCOPIC IMPRESSION: 1) Moderate diverticulosis in the sigmoid to descending colon segments 2) No polyps or cancers 3) Otherwise normal examination RECOMMENDATIONS: 1) High fiber diet. 2) Continue current colorectal screening recommendations for "routine risk" patients with a repeat colonoscopy in 10 years.  REPEAT EXAM:  No  ______________________________ Vania Rea. Jarold Motto, MD, [facility]  CC:  n. eSIGNED:   Vania Rea. Tanazia Achee at 03/26/2012 02:26 PM  Marlana Latus, [phone]

## 2012-03-26 NOTE — Patient Instructions (Signed)
Findings:  Diverticulosis Recommendations: High Fiber Diet                                  Repeat colonoscopy in 10 years  YOU HAD AN ENDOSCOPIC PROCEDURE TODAY AT THE [facility]: Refer to the procedure report that was given to you for any specific questions about what was found during the examination.  If the procedure report does not answer your questions, please call your gastroenterologist to clarify.  If you requested that your care partner not be given the details of your procedure findings, then the procedure report has been included in a sealed envelope for you to review at your convenience later.  YOU SHOULD EXPECT: Some feelings of bloating in the abdomen. Passage of more gas than usual.  Walking can help get rid of the air that was put into your GI tract during the procedure and reduce the bloating. If you had a lower endoscopy (such as a colonoscopy or flexible sigmoidoscopy) you may notice spotting of blood in your stool or on the toilet paper. If you underwent a bowel prep for your procedure, then you may not have a normal bowel movement for a few days.  DIET: Your first meal following the procedure should be a light meal and then it is ok to progress to your normal diet.  A half-sandwich or bowl of soup is an example of a good first meal.  Heavy or fried foods are harder to digest and may make you feel nauseous or bloated.  Likewise meals heavy in dairy and vegetables can cause extra gas to form and this can also increase the bloating.  Drink plenty of fluids but you should avoid alcoholic beverages for 24 hours.  ACTIVITY: Your care partner should take you home directly after the procedure.  You should plan to take it easy, moving slowly for the rest of the day.  You can resume normal activity the day after the procedure however you should NOT DRIVE or use heavy machinery for 24 hours (because of the sedation medicines used during the test).    SYMPTOMS TO REPORT  IMMEDIATELY: A gastroenterologist can be reached at any hour.  During normal business hours, 8:30 AM to 5:00 PM Monday through Friday, call [phone].  After hours and on weekends, please call the GI answering service at [phone] who will take a message and have the physician on call contact you.   Following lower endoscopy (colonoscopy or flexible sigmoidoscopy):  Excessive amounts of blood in the stool  Significant tenderness or worsening of abdominal pains  Swelling of the abdomen that is new, acute  Fever of 100F or higher  Following upper endoscopy (EGD)  Vomiting of blood or coffee ground material  New chest pain or pain under the shoulder blades  Painful or persistently difficult swallowing  New shortness of breath  Fever of 100F or higher  Black, tarry-looking stools  FOLLOW UP: If any biopsies were taken you will be contacted by phone or by letter within the next 1-3 weeks.  Call your gastroenterologist if you have not heard about the biopsies in 3 weeks.  Our staff will call the home number listed on your records the next business day following your procedure to check on you and address any questions or concerns that you may have at that time regarding the information given to you following your procedure. This is a  courtesy call and so if there is no answer at the home number and we have not heard from you through the emergency physician on call, we will assume that you have returned to your regular daily activities without incident.  SIGNATURES/CONFIDENTIALITY: You and/or your care partner have signed paperwork which will be entered into your electronic medical record.  These signatures attest to the fact that that the information above on your After Visit Summary has been reviewed and is understood.  Full responsibility of the confidentiality of this discharge information lies with you and/or your care-partner.   Please follow all discharge instructions given to you  by the recovery room nurse. If you have any questions or problems after discharge please call one of the numbers listed above. You will receive a phone call in the am to see how you are doing and answer any questions you may have. Thank you for choosing [facility] for your health care needs.

## 2012-03-29 ENCOUNTER — Telehealth: Payer: Self-pay

## 2012-03-29 NOTE — Telephone Encounter (Signed)
  Follow up Call-  Call back number 03/26/2012  Post procedure Call Back phone  # [phone]  Permission to leave phone message Yes     Patient questions:  Do you have a fever, pain , or abdominal swelling? no Pain Score  0 *  Have you tolerated food without any problems? yes  Have you been able to return to your normal activities? yes  Do you have any questions about your discharge instructions: Diet   no Medications  no Follow up visit  no  Do you have questions or concerns about your Care? no  Actions: * If pain score is 4 or above: No action needed, pain <4.

## 2012-08-19 ENCOUNTER — Other Ambulatory Visit: Payer: Self-pay | Admitting: Pulmonary Disease

## 2012-10-04 ENCOUNTER — Telehealth: Payer: Self-pay | Admitting: Pulmonary Disease

## 2012-10-04 MED ORDER — FLUOCINONIDE 0.05 % EX SOLN: 1.0000 "application " | Freq: Two times a day (BID) | CUTANEOUS | Status: DC

## 2012-10-04 NOTE — Telephone Encounter (Signed)
Per SN, this is ok.  -----  lmomtcb to inform pt rx sent to [facility]

## 2012-10-04 NOTE — Telephone Encounter (Signed)
Informed patient rx was sent to pharmacy.  Nothing else needed.

## 2012-10-04 NOTE — Telephone Encounter (Signed)
Pt requesting Fluocinonide 0.05% cream .apply 2 times a day. No Known Allergies Dr Kriste Basque please advise  Thank you

## 2012-11-05 ENCOUNTER — Ambulatory Visit (HOSPITAL_COMMUNITY)
Admission: RE | Admit: 2012-11-05 | Discharge: 2012-11-05 | Disposition: A | Discharge status: Home / Self Care | Payer: Managed Care, Other (non HMO) | Source: Ambulatory Visit | Attending: Adult Health | Admitting: Adult Health

## 2012-11-05 ENCOUNTER — Encounter: Payer: Self-pay | Admitting: Adult Health

## 2012-11-05 ENCOUNTER — Other Ambulatory Visit (INDEPENDENT_AMBULATORY_CARE_PROVIDER_SITE_OTHER): Payer: Managed Care, Other (non HMO)

## 2012-11-05 ENCOUNTER — Ambulatory Visit (INDEPENDENT_AMBULATORY_CARE_PROVIDER_SITE_OTHER): Payer: Managed Care, Other (non HMO) | Admitting: Adult Health

## 2012-11-05 VITALS — BP 130/84 | HR 88 | Temp 99.3°F | Ht 71.0 in | Wt 243.0 lb

## 2012-11-05 DIAGNOSIS — R1011 Right upper quadrant pain: Secondary | ICD-10-CM | POA: Insufficient documentation

## 2012-11-05 DIAGNOSIS — Q619 Cystic kidney disease, unspecified: Secondary | ICD-10-CM | POA: Insufficient documentation

## 2012-11-05 DIAGNOSIS — R52 Pain, unspecified: Secondary | ICD-10-CM

## 2012-11-05 LAB — URINALYSIS, ROUTINE W REFLEX MICROSCOPIC
Nitrite: NEGATIVE
Specific Gravity, Urine: 1.025 (ref 1.000–1.030)
Total Protein, Urine: NEGATIVE
Urine Glucose: NEGATIVE
pH: 6 (ref 5.0–8.0)

## 2012-11-05 LAB — HEPATIC FUNCTION PANEL
ALT: 19 U/L (ref 0–53)
AST: 14 U/L (ref 0–37)
Alkaline Phosphatase: 71 U/L (ref 39–117)
Bilirubin, Direct: 0.1 mg/dL (ref 0.0–0.3)
Total Bilirubin: 0.7 mg/dL (ref 0.3–1.2)

## 2012-11-05 LAB — CBC WITH DIFFERENTIAL/PLATELET
Basophils Relative: 0.6 % (ref 0.0–3.0)
Eosinophils Absolute: 0 10*3/uL (ref 0.0–0.7)
Lymphocytes Relative: 28.8 % (ref 12.0–46.0)
MCHC: 33.4 g/dL (ref 30.0–36.0)
Neutrophils Relative %: 59.9 % (ref 43.0–77.0)
Platelets: 235 10*3/uL (ref 150.0–400.0)
RBC: 4.44 Mil/uL (ref 4.22–5.81)
WBC: 7.9 10*3/uL (ref 4.5–10.5)

## 2012-11-05 LAB — BASIC METABOLIC PANEL
BUN: 16 mg/dL (ref 6–23)
Chloride: 106 mEq/L (ref 96–112)
Potassium: 3.8 mEq/L (ref 3.5–5.1)
Sodium: 140 mEq/L (ref 135–145)

## 2012-11-05 MED ORDER — HYDROCODONE-ACETAMINOPHEN 5-325 MG PO TABS: 1.0000 | ORAL_TABLET | Freq: Four times a day (QID) | ORAL | Status: DC | PRN

## 2012-11-05 NOTE — Addendum Note (Signed)
Addended by: Boone Master E on: 11/05/2012 03:33 PM   Modules accepted: Orders

## 2012-11-05 NOTE — Assessment & Plan Note (Signed)
Right sided abdominal pain ? Etiology  Set up for stat labs w/ amylase and lipase Stat US -abd   Plan  [place] abd  Labs stat  zofran and vicodin As needed   Please contact office for sooner follow up if symptoms do not improve or worsen or seek emergency care  Clear liquid

## 2012-11-05 NOTE — Patient Instructions (Addendum)
I will call with labs and [place] results.  Then we will decide on next step.  Advance Clear liquid diet as tolerated.  Zofran 4mg  every 6 hr as needed for n/v.  Please contact office for sooner follow up if symptoms do not improve or worsen or seek emergency care

## 2012-11-05 NOTE — Addendum Note (Signed)
Addended by: Boone Master E on: 11/05/2012 03:30 PM   Modules accepted: Orders

## 2012-11-05 NOTE — Progress Notes (Signed)
Subjective:     Patient ID: Russell Bullock, male   DOB: 1951-07-02, 62 y.o.   MRN: [id number]  HPI  61y/o WM   ~  July 08, 2010:  last seen by me 3/10 for CPX & f/u of his AR, AB, Hyperlipidemia, GERD, etc... presents today w/ 3wk hx RUQ pain- below right costal margin, tender, hurts to roll over at night but NOT tender to palp on ribs of lower chest wall- assoc nausea, no vomit, +bloating, no ch in bowels, no hx GB dis or prev eval... we discussed checking Sonar & Labs>    Otherw stable- takes Levsin, Zantac as needed;  breathing satis w/o cough, SOB, etc;  no CP, paklpit, etc;  uses Valium 2mg  as needed for vertigo...  ~  November 06, 2010:  Add-on for sinusitis c/o congestion, drainage, blowing yellow "crud" for 1 wk w/ cough & beige sputum... ?fever, +chills, body aches, some HA, fatigue,etc... taking OTC Tylenol only & feeling poorly asking for 3d off work due to systemic symptoms... we discussed Rx w/ Pred, Augmentin, Mucinex, etc...  ~  Feb 25, 2012:  56mo ROV & CPX> here for yearly check up & states he's been doing well, feeling well, no new complaints or concerns; he is excited about starting the [employer]ystem diet plan & knows he will be successful- already down 3# to 237#...  We reviewed his problems, meds, labs, etc> see below>> CXR 5/13 showed borderline heart size &tortuous Ao, sl hyperinflation, min degen spondylosis, clear & NAD.Marland KitchenMarland Kitchen EKG 5/13 showed NSR, rate74, WNL, NAD... LABS 4/13:  FLP- at goals on Simva40;  Chems- ok w/ BS=103;  CBC= wnl;  TSH=1.80;  VitD=40;  PSA=0.82  11/05/2012 Acute OV  Complains of stomach pain x 1 week. reports N/V and chills.  Right sided stomach pressure, tightness, spasms, bloating.  Food causes increased gastric fullness and bloating.  No urinary symptoms. No flank pain.   No fever. Low grade today in office. Chills last pm.  No diarrhea and bloody stools.  N/V x 3 last pm.  No appetitie this am.     Problem List:    << PROBLEM LIST UPDATED  02/25/12 >>  ALLERGIC RHINITIS (ICD-477.9) & OTITIS MEDIA, LEFT (ICD-382.9) - uses OTC antihistamines + FLONASE... hx otitis media requiring ENT- w/ bilat tympanoplasties by DrCrossly in the past...  hx Vertigo w/ Rx Valium 2mg  in past- he had an extensive eval by DrRosen & [facility] DrMay w/ rec for Vestib Rehab  for Otolith disease...  Hx of ASTHMATIC BRONCHITIS, ACUTE (ICD-466.0) - he is a non-smoker w/ hx of recurrent bronchitic infections... ~  CXR 3/10 showed heart at upper lim norm, clear lungs, question sm HH, NAD.Marland Kitchen. ~  CXR 5/13 showed borderline heart size &tortuous Ao, sl hyperinflation, min degen spondylosis, clear & NAD...  HYPERLIPIDEMIA (ICD-272.4) - on SIMVASTATIN 40mg /d... ~  FLP 3/10 on Simva40 showed TChol 145, TG 114, HDL 40, LDL 82... rec> continue same. ~  FLP 4/13 on Simva40 showed TChol 168, TG 143, HDL 45, LDL 94  GERD (ICD-530.81) - prev on Prilosec Prn, now uses OTC Ranitadine 150mg /d... ~  9/11: presented w/ 3wk hx RUQ pain> Labs all neg & Sonar- normal GB & ducts...  HEMORRHOIDS (ICD-455.6) - colonoscopy 8/01 by DrPatterson was neg x for some min int hems... ~  5/13: he is overdue for f/u colonoscopy by DrPatterson & we will refer...   Hx of RENAL CALCULUS (ICD-592.0) - he had a kidney stone on the left  requiring cystoscopic stone extraction in 1997 by DrRDavis et al... ~  Abd sonar 9/11 showed norm GB & ducts, bilat mild renal parenchymal thinning; sm left renal cyst, otherw neg...  HEADACHE (ICD-784.0) - eval by DrWeymann in 7/06... 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 of decr flow in right vertebral & ? 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 subseq 4 vessel cerebral angiogram by DrTDeveshwar was totally WNL.Marland KitchenMarland Kitchen  Hx of HERPES ZOSTER (ICD-053.9) & POSTHERPETIC NEURALGIA (ICD-053.19) - seen 8/08 with right V1shingles- facial/eye/scalp herpes zoster, treated with Valtrex and Prednisone taper... seen by Optometrist and recommend to use suppressive anti-virals for 6  month with acyclovir- labs revealed a negative HIV test, and postive HSV I/II IgM titer and negative HSV 2 IgG Ab... he required Lyrica for the postherpetic neuralgia and it resolved.  VITAMIN D DEFICIENCY (ICD-268.9) - on Vit D 50,000 u weekly... ~  Labs 3/10 showed  Vit D level = 18... Pt started vit D supplement ~  Labs 1/12 showed Vit D level = 23... Continue supplement ~  Labs 4/13 showed Vit D level = 40... He remains on 50K weekly...  Health Maintenance - takes ASA 81mg /d... ~  f/u colonoscopy is now overdue ==> refer to GI DrPatterson. ~  labs 4/13 showed PSA= 0.82;  on Viagra Prn... ~  Immunizations:  encouraged to get yearly Flu vaccine each autumn.   Past Surgical History  Procedure Date  . Incision and drainage / excision thyroglossal cyst 1954  . Bilate tympanomastoid surgery   . Cystoscopic left renal stone extraction 1997    Dr Monico Blitz  . Right ureteroscopic stone extraction 2004    Dr Vernie Ammons  . Colonoscopy     Outpatient Encounter Prescriptions as of 11/05/2012  Medication Sig Dispense Refill  . aspirin 81 MG tablet Take 81 mg by mouth daily.      . diazepam (VALIUM) 2 MG tablet Take 2 mg by mouth daily as needed.       . fluocinonide (LIDEX) 0.05 % external solution Apply 1 application topically 2 (two) times daily.  60 mL  0  . fluticasone (FLONASE) 50 MCG/ACT nasal spray USE 2 SPRAYS NASALLY DAILY  48 g  0  . ranitidine (ZANTAC) 150 MG tablet Take 150 mg by mouth at bedtime as needed.      . simvastatin (ZOCOR) 40 MG tablet Take 1 tablet (40 mg total) by mouth at bedtime.  90 tablet  3  . VIAGRA 100 MG tablet USE AS DIRECTED  10 tablet  3  . Vitamin D, Ergocalciferol, (DRISDOL) 50000 UNITS CAPS TAKE 1 CAPSULE EVERY 7 DAYS  12 capsule  3    No Known Allergies   Current Medications, Allergies, Past Medical History, Past Surgical History, Family History, and Social History were reviewed in [employer] record.   Review of Systems          Constitutional:   No  weight loss, night sweats,  Fevers, chills, fatigue, or  lassitude.  HEENT:   No headaches,  Difficulty swallowing,  Tooth/dental problems, or  Sore throat,                No sneezing, itching, ear ache, nasal congestion, post nasal drip,   CV:  No chest pain,  Orthopnea, PND, swelling in lower extremities, anasarca, dizziness, palpitations, syncope.   GI  No heartburn, indigestion, abdominal pain, nausea, vomiting, diarrhea, change in bowel habits, loss of appetite, bloody stools.  Resp: No shortness of breath with exertion or at rest.  No excess mucus, no productive cough,  No non-productive cough,  No coughing up of blood.  No change in color of mucus.  No wheezing.  No chest wall deformity  Skin: no rash or lesions.  GU: no dysuria, change in color of urine, no urgency or frequency.  No flank pain, no hematuria   MS:  No joint pain or swelling.  No decreased range of motion.  No back pain.  Psych:  No change in mood or affect. No depression or anxiety.  No memory loss.       Objective:   Physical Exam     WD, WN, 62 y/o WM in NAD... GENERAL:  Alert & oriented; pleasant & cooperative... HEENT:  [place]/AT  TMs-wnl,  NOSE-pale w/ clear discharge , THROAT-clear & wnl.   NECK:  Supple w/ full ROM; no JVD; normal carotid impulses w/o bruits; no thyromegaly or nodules palpated; no lymphadenopathy. CHEST:  Clear to P & A; without wheezes/ rales/ or rhonchi. HEART:  Regular Rhythm; without murmurs/ rubs/ or gallops. ABD: Soft, +RUQ tender  +RLQ tenderness, no guarding or rebound  no organomegaly or masses palp... EXT: w/o deformities, mild arthritic changes, no VV, mild VI, no edema... NEURO:  CN's intact, no focal neuro deficits... DERM:  no lesions seen...   Assessment:

## 2012-11-07 LAB — URINE CULTURE: Colony Count: NO GROWTH

## 2012-11-10 NOTE — Progress Notes (Signed)
Quick Note:  LMOM TCB x1. ______ 

## 2012-11-11 NOTE — Progress Notes (Signed)
Quick Note:  Called spoke with patient, advised of lab results / recs as stated by TP. Pt verbalized his understanding and denied any questions. ______ 

## 2013-01-09 ENCOUNTER — Other Ambulatory Visit: Payer: Self-pay | Admitting: Pulmonary Disease

## 2013-02-10 ENCOUNTER — Other Ambulatory Visit: Payer: Self-pay | Admitting: Pulmonary Disease

## 2013-05-23 ENCOUNTER — Other Ambulatory Visit: Payer: Self-pay | Admitting: Pulmonary Disease

## 2013-05-24 ENCOUNTER — Telehealth: Payer: Self-pay | Admitting: Pulmonary Disease

## 2013-05-24 MED ORDER — DIAZEPAM 2 MG PO TABS: 2.0000 mg | ORAL_TABLET | Freq: Every day | ORAL | Status: DC | PRN

## 2013-05-24 NOTE — Telephone Encounter (Signed)
Spoke with patient, patient states he had a bout with vertigo this weekend Says he was told to stick diazepam under tongue to help with this  Says he hasn't had a refill since 2010 because he hasn't really needed it but feels he needs it now Dr. Kriste Basque please advise if Rx can be filled-- Diazepam 2mg  1 tab daily PRN  Last OV: 11/05/12 w TP to return in 4 weeks no pending appts scheduled

## 2013-05-24 NOTE — Telephone Encounter (Signed)
Rx has been called in. Pt is aware. 

## 2013-05-24 NOTE — Telephone Encounter (Signed)
Per SN---  Ok to refill the diazepam 2 mg   1 tab daily prn   #90   Refill x 3.

## 2013-10-16 ENCOUNTER — Other Ambulatory Visit: Payer: Self-pay | Admitting: Pulmonary Disease

## 2014-01-06 ENCOUNTER — Other Ambulatory Visit: Payer: Self-pay | Admitting: Pulmonary Disease

## 2014-01-09 ENCOUNTER — Telehealth: Payer: Self-pay | Admitting: Pulmonary Disease

## 2014-01-09 NOTE — Telephone Encounter (Signed)
Spoke with pt. Advised that we do not have any appointments today or tomorrow. He asks if anything is available Wednesday, since he is off work. OV has been made for 01/11/14 at 9:45am per the pt's request.

## 2014-01-11 ENCOUNTER — Ambulatory Visit (INDEPENDENT_AMBULATORY_CARE_PROVIDER_SITE_OTHER): Payer: Managed Care, Other (non HMO) | Admitting: Adult Health

## 2014-01-11 ENCOUNTER — Encounter: Payer: Self-pay | Admitting: Adult Health

## 2014-01-11 ENCOUNTER — Ambulatory Visit (INDEPENDENT_AMBULATORY_CARE_PROVIDER_SITE_OTHER)
Admission: RE | Admit: 2014-01-11 | Discharge: 2014-01-11 | Disposition: A | Discharge status: Home / Self Care | Payer: Managed Care, Other (non HMO) | Source: Ambulatory Visit | Attending: Adult Health | Admitting: Adult Health

## 2014-01-11 VITALS — BP 132/80 | HR 77 | Temp 98.8°F | Ht 70.0 in | Wt 247.2 lb

## 2014-01-11 DIAGNOSIS — J209 Acute bronchitis, unspecified: Secondary | ICD-10-CM

## 2014-01-11 MED ORDER — AMOXICILLIN-POT CLAVULANATE 875-125 MG PO TABS: 1.0000 | ORAL_TABLET | Freq: Two times a day (BID) | ORAL | Status: AC

## 2014-01-11 MED ORDER — HYDROCODONE-HOMATROPINE 5-1.5 MG/5ML PO SYRP: 5.0000 mL | ORAL_SOLUTION | Freq: Four times a day (QID) | ORAL | Status: DC | PRN

## 2014-01-11 NOTE — Progress Notes (Signed)
Subjective:     Patient ID: Russell Bullock, male   DOB: Mar 01, 1951, 63 y.o.   MRN: [id number]  HPI  63y/o [facility]   01/11/2014 Acute OV  Patient presents for an acute office visit. Next Complains of chest tightness, prod cough with light yellow/clear mucus, increased w/ lying down and exertion, body aches/chills, head congestion w/ PND x5days, wheezing at onset.   Denies hemoptysis, nausea, vomiting Taking Nyquil with some help.  Not sleeping d/t to cough .  No recent travel or abx use.    Problem List:    << PROBLEM LIST UPDATED 02/25/12 >>  ALLERGIC RHINITIS (ICD-477.9) & OTITIS MEDIA, LEFT (ICD-382.9) - uses OTC antihistamines + FLONASE... hx otitis media requiring ENT- w/ bilat tympanoplasties by DrCrossly in the past...  hx Vertigo w/ Rx Valium 2mg  in past- he had an extensive eval by [place] DrMay w/ rec for Vestib Rehab  for Otolith disease...  Hx of ASTHMATIC BRONCHITIS, ACUTE (ICD-466.0) - he is a non-smoker w/ hx of recurrent bronchitic infections... ~  CXR 3/10 showed heart at upper lim norm, clear lungs, question sm HH, NAD.Marland Kitchen. ~  CXR 5/13 showed borderline heart size &tortuous Ao, sl hyperinflation, min degen spondylosis, clear & NAD...  HYPERLIPIDEMIA (ICD-272.4) - on SIMVASTATIN 40mg /d... ~  FLP 3/10 on Simva40 showed TChol 145, TG 114, HDL 40, LDL 82... rec> continue same. ~  Brady 4/13 on Simva40 showed TChol 168, TG 143, HDL 45, LDL 94  GERD (ICD-530.81) - prev on Prilosec Prn, now uses OTC Ranitadine 150mg /d... ~  9/11: presented w/ 3wk hx RUQ pain> Labs all neg & Sonar- normal GB & ducts...  HEMORRHOIDS (ICD-455.6) - colonoscopy 8/01 by DrPatterson was neg x for some min int hems... ~  5/13: he is overdue for f/u colonoscopy by DrPatterson & we will refer...   Hx of RENAL CALCULUS (ICD-592.0) - he had a kidney stone on the left requiring cystoscopic stone extraction in 1997 by DrRDavis et al... ~  Abd sonar 9/11 showed norm GB & ducts, bilat mild renal parenchymal  thinning; sm left renal cyst, otherw neg...  HEADACHE (ICD-784.0) - eval by DrWeymann in 7/06... 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 of decr flow in right vertebral & ? 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 subseq 4 vessel cerebral angiogram by DrTDeveshwar was totally WNL.Marland KitchenMarland Kitchen  Hx of HERPES ZOSTER (ICD-053.9) & POSTHERPETIC NEURALGIA (ICD-053.19) - seen 8/08 with right V1shingles- facial/eye/scalp herpes zoster, treated with Valtrex and Prednisone taper... seen by Optometrist and recommend to use suppressive anti-virals for 6 month with acyclovir- labs revealed a negative HIV test, and postive HSV I/II IgM titer and negative HSV 2 IgG Ab... he required Lyrica for the postherpetic neuralgia and it resolved.  VITAMIN D DEFICIENCY (ICD-268.9) - on Vit D 50,000 u weekly... ~  Labs 3/10 showed  Vit D level = 18... Pt started vit D supplement ~  Labs 1/12 showed Vit D level = 23... Continue supplement ~  Labs 4/13 showed Vit D level = 40... He remains on 50K weekly...  Health Maintenance - takes ASA 81mg /d... ~  f/u colonoscopy is now overdue ==> refer to GI DrPatterson. ~  labs 4/13 showed PSA= 0.82;  on Viagra Prn... ~  Immunizations:  encouraged to get yearly Flu vaccine each autumn.   Past Surgical History  Procedure Laterality Date  . Incision and drainage / excision thyroglossal cyst  1954  . Bilate tympanomastoid surgery    . Cystoscopic  left renal stone extraction  1997    Dr Leander Rams  . Right ureteroscopic stone extraction  2004    Dr Karsten Ro  . Colonoscopy      Outpatient Encounter Prescriptions as of 01/11/2014  Medication Sig  . aspirin 81 MG tablet Take 81 mg by mouth daily.  . diazepam (VALIUM) 2 MG tablet Take 1 tablet (2 mg total) by mouth daily as needed.  . fluocinonide (LIDEX) 0.05 % external solution Apply 1 application topically 2 (two) times daily.  . fluticasone (FLONASE) 50 MCG/ACT nasal spray USE 2 SPRAYS NASALLY DAILY  . HYDROcodone-acetaminophen  (NORCO/VICODIN) 5-325 MG per tablet Take 1 tablet by mouth every 6 (six) hours as needed for pain.  . ranitidine (ZANTAC) 150 MG tablet Take 150 mg by mouth at bedtime as needed.  . simvastatin (ZOCOR) 40 MG tablet TAKE 1 TABLET AT BEDTIME (PATIENT WILL NEED OFFICE VISIT WITH DR. NADEL FOR FURTHER REFILLS)  . VIAGRA 100 MG tablet USE AS DIRECTED  . Vitamin D, Ergocalciferol, (DRISDOL) 50000 UNITS CAPS capsule TAKE 1 CAPSULE EVERY 7 DAYS    No Known Allergies   Current Medications, Allergies, Past Medical History, Past Surgical History, Family History, and Social History were reviewed in [employer] record.   Review of Systems         Constitutional:   No  weight loss, night sweats,  Fevers, chills, fatigue, or  lassitude.  HEENT:   No headaches,  Difficulty swallowing,  Tooth/dental problems, or  Sore throat,                No sneezing, itching, ear ache,  ++nasal congestion, post nasal drip,   CV:  No chest pain,  Orthopnea, PND, swelling in lower extremities, anasarca, dizziness, palpitations, syncope.   GI  No heartburn, indigestion, abdominal pain, nausea, vomiting, diarrhea, change in bowel habits, loss of appetite, bloody stools.   Resp:    No chest wall deformity  Skin: no rash or lesions.  GU: no dysuria, change in color of urine, no urgency or frequency.  No flank pain, no hematuria   MS:  No joint pain or swelling.  No decreased range of motion.  No back pain.  Psych:  No change in mood or affect. No depression or anxiety.  No memory loss.       Objective:   Physical Exam     WD, WN, 63 y/o WM in NAD... GENERAL:  Alert & oriented; pleasant & cooperative... HEENT:  /AT  TMs-wnl,  NOSE-pale w/ clear discharge , THROAT-clear & wnl.   NECK:  Supple w/ full ROM; no JVD; normal carotid impulses w/o bruits; no thyromegaly or nodules palpated; no lymphadenopathy. CHEST:  Clear to P & A; without wheezes/ rales/ or rhonchi. HEART:  Regular  Rhythm; without murmurs/ rubs/ or gallops. ABD NT , BS + x 4 q, no organomegaly or masses palp... EXT: w/o deformities, mild arthritic changes, no VV, mild VI, no edema... NEURO:  no focal neuro deficits... DERM:  no lesions seen...   Assessment:

## 2014-01-11 NOTE — Patient Instructions (Signed)
Augmentin 875 mg twice daily for 7 days, take with food. Mucinex DM twice daily as needed. For cough and congestion. Hydromet 1 teaspoon every 6 hours as needed. For cough, may make sleepy. Chest xray today  Please contact office for sooner follow up if symptoms do not improve or worsen or seek emergency care  Dr. Lenna Gilford is retiring and we will need to set you up with a new Primary Care provider.  Please let us know if you would like [place] to assist with this referral.

## 2014-01-11 NOTE — Assessment & Plan Note (Signed)
Flare  Check cxr today   Plan  Augmentin 875 mg twice daily for 7 days, take with food. Mucinex DM twice daily as needed. For cough and congestion. Hydromet 1 teaspoon every 6 hours as needed. For cough, may make sleepy. Chest xray today  Please contact office for sooner follow up if symptoms do not improve or worsen or seek emergency care  Dr. Lenna Gilford is retiring and we will need to set you up with a new Primary Care provider.  Please let us know if you would like [place] to assist with this referral.

## 2014-01-17 ENCOUNTER — Telehealth: Payer: Self-pay | Admitting: Pulmonary Disease

## 2014-01-17 DIAGNOSIS — Z0000 Encounter for general adult medical examination without abnormal findings: Secondary | ICD-10-CM

## 2014-01-17 DIAGNOSIS — E785 Hyperlipidemia, unspecified: Secondary | ICD-10-CM

## 2014-01-17 DIAGNOSIS — K589 Irritable bowel syndrome without diarrhea: Secondary | ICD-10-CM

## 2014-01-17 DIAGNOSIS — E559 Vitamin D deficiency, unspecified: Secondary | ICD-10-CM

## 2014-01-17 NOTE — Telephone Encounter (Signed)
Spoke with pt and informed that lab orders were placed and he can come fasting on 01/18/14 to have these drawn.

## 2014-01-17 NOTE — Telephone Encounter (Signed)
LMOM TCB x1 Pt has not had any labs done since 2014, so needs full fasting labs -- can come anytime prior to appt Orders placed for CBCD, BMET, Lipid, Hepatic, TSH, PSA, Vit D, udip

## 2014-01-18 ENCOUNTER — Other Ambulatory Visit (INDEPENDENT_AMBULATORY_CARE_PROVIDER_SITE_OTHER): Payer: Managed Care, Other (non HMO)

## 2014-01-18 DIAGNOSIS — E785 Hyperlipidemia, unspecified: Secondary | ICD-10-CM

## 2014-01-18 DIAGNOSIS — E559 Vitamin D deficiency, unspecified: Secondary | ICD-10-CM

## 2014-01-18 DIAGNOSIS — K589 Irritable bowel syndrome without diarrhea: Secondary | ICD-10-CM

## 2014-01-18 DIAGNOSIS — Z0000 Encounter for general adult medical examination without abnormal findings: Secondary | ICD-10-CM

## 2014-01-18 LAB — URINALYSIS
Bilirubin Urine: NEGATIVE
HGB URINE DIPSTICK: NEGATIVE
Ketones, ur: NEGATIVE
LEUKOCYTES UA: NEGATIVE
Nitrite: NEGATIVE
SPECIFIC GRAVITY, URINE: 1.025 (ref 1.000–1.030)
UROBILINOGEN UA: 0.2 (ref 0.0–1.0)
Urine Glucose: NEGATIVE
pH: 6 (ref 5.0–8.0)

## 2014-01-18 LAB — LIPID PANEL
CHOL/HDL RATIO: 4
Cholesterol: 159 mg/dL (ref 0–200)
HDL: 42.9 mg/dL (ref >39.00)
LDL CALC: 90 mg/dL (ref 0–99)
Triglycerides: 131 mg/dL (ref 0.0–149.0)
VLDL: 26.2 mg/dL (ref 0.0–40.0)

## 2014-01-18 LAB — HEPATIC FUNCTION PANEL
ALT: 20 U/L (ref 0–53)
AST: 16 U/L (ref 0–37)
Albumin: 4.3 g/dL (ref 3.5–5.2)
Alkaline Phosphatase: 68 U/L (ref 39–117)
BILIRUBIN DIRECT: 0.1 mg/dL (ref 0.0–0.3)
BILIRUBIN TOTAL: 0.8 mg/dL (ref 0.3–1.2)
Total Protein: 7.2 g/dL (ref 6.0–8.3)

## 2014-01-18 LAB — CBC WITH DIFFERENTIAL/PLATELET
BASOS PCT: 0.5 % (ref 0.0–3.0)
Basophils Absolute: 0 10*3/uL (ref 0.0–0.1)
EOS PCT: 1.6 % (ref 0.0–5.0)
Eosinophils Absolute: 0.1 10*3/uL (ref 0.0–0.7)
HCT: 38.3 % — ABNORMAL LOW (ref 39.0–52.0)
Hemoglobin: 12.6 g/dL — ABNORMAL LOW (ref 13.0–17.0)
LYMPHS PCT: 43.7 % (ref 12.0–46.0)
Lymphs Abs: 2.8 10*3/uL (ref 0.7–4.0)
MCHC: 33 g/dL (ref 30.0–36.0)
MCV: 82.8 fl (ref 78.0–100.0)
MONO ABS: 0.6 10*3/uL (ref 0.1–1.0)
MONOS PCT: 9.6 % (ref 3.0–12.0)
NEUTROS PCT: 44.6 % (ref 43.0–77.0)
Neutro Abs: 2.8 10*3/uL (ref 1.4–7.7)
Platelets: 268 10*3/uL (ref 150.0–400.0)
RBC: 4.62 Mil/uL (ref 4.22–5.81)
RDW: 15.2 % — ABNORMAL HIGH (ref 11.5–14.6)
WBC: 6.4 10*3/uL (ref 4.5–10.5)

## 2014-01-18 LAB — BASIC METABOLIC PANEL
BUN: 19 mg/dL (ref 6–23)
CHLORIDE: 104 meq/L (ref 96–112)
CO2: 28 meq/L (ref 19–32)
Calcium: 9.3 mg/dL (ref 8.4–10.5)
Creatinine, Ser: 1.1 mg/dL (ref 0.4–1.5)
GFR: 75.99 mL/min (ref >60.00)
GLUCOSE: 117 mg/dL — AB (ref 70–99)
POTASSIUM: 4.3 meq/L (ref 3.5–5.1)
Sodium: 140 mEq/L (ref 135–145)

## 2014-01-18 LAB — TSH: TSH: 2.44 u[IU]/mL (ref 0.35–5.50)

## 2014-01-18 LAB — PSA: PSA: 1.06 ng/mL (ref 0.10–4.00)

## 2014-01-19 ENCOUNTER — Encounter: Payer: Self-pay | Admitting: Pulmonary Disease

## 2014-01-19 ENCOUNTER — Ambulatory Visit (INDEPENDENT_AMBULATORY_CARE_PROVIDER_SITE_OTHER): Payer: Managed Care, Other (non HMO) | Admitting: Pulmonary Disease

## 2014-01-19 VITALS — BP 136/84 | HR 91 | Temp 97.5°F | Ht 70.0 in | Wt 245.2 lb

## 2014-01-19 DIAGNOSIS — K589 Irritable bowel syndrome without diarrhea: Secondary | ICD-10-CM

## 2014-01-19 DIAGNOSIS — J209 Acute bronchitis, unspecified: Secondary | ICD-10-CM

## 2014-01-19 DIAGNOSIS — E559 Vitamin D deficiency, unspecified: Secondary | ICD-10-CM

## 2014-01-19 DIAGNOSIS — K219 Gastro-esophageal reflux disease without esophagitis: Secondary | ICD-10-CM

## 2014-01-19 DIAGNOSIS — E785 Hyperlipidemia, unspecified: Secondary | ICD-10-CM

## 2014-01-19 DIAGNOSIS — Z0000 Encounter for general adult medical examination without abnormal findings: Secondary | ICD-10-CM

## 2014-01-19 DIAGNOSIS — J309 Allergic rhinitis, unspecified: Secondary | ICD-10-CM

## 2014-01-19 NOTE — Patient Instructions (Signed)
Today we updated your med list in our EPIC system...    Continue your current medications the same...  Call for any questions or if we can be of service in any way.Marland KitchenMarland Kitchen

## 2014-01-19 NOTE — Progress Notes (Signed)
Subjective:     Patient ID: Russell Bullock, male   DOB: 22-Sep-1951, 63 y.o.   MRN: [id number]  HPI 63 y/o WM here for a follow up...  he has multiple medical problems as noted below...   ~  July 08, 2010:  last seen by me 3/10 for CPX & f/u of his AR, AB, Hyperlipidemia, GERD, etc... presents today w/ 3wk hx RUQ pain- below right costal margin, tender, hurts to roll over at night but NOT tender to palp on ribs of lower chest wall- assoc nausea, no vomit, +bloating, no ch in bowels, no hx GB dis or prev eval... we discussed checking Sonar & Labs>    Otherw stable- takes Levsin, Zantac as needed;  breathing satis w/o cough, SOB, etc;  no CP, paklpit, etc;  uses Valium 2mg  as needed for vertigo...  ~  November 06, 2010:  Add-on for sinusitis c/o congestion, drainage, blowing yellow "crud" for 1 wk w/ cough & beige sputum... ?fever, +chills, body aches, some HA, fatigue,etc... taking OTC Tylenol only & feeling poorly asking for 3d off work due to systemic symptoms... we discussed Rx w/ Pred, Augmentin, Mucinex, etc...  ~  Feb 25, 2012:  51mo ROV & CPX> here for yearly check up & states he's been doing well, feeling well, no new complaints or concerns; he is excited about starting the Nutrisystem diet plan & knows he will be successful- already down 3# to 237#...  We reviewed his problems, meds, labs, etc> see below>>  CXR 5/13 showed borderline heart size &tortuous Ao, sl hyperinflation, min degen spondylosis, clear & NAD.Marland KitchenMarland Kitchen  EKG 5/13 showed NSR, rate74, WNL, NAD...  LABS 4/13:  FLP- at goals on Simva40;  Chems- ok w/ BS=103;  CBC= wnl;  TSH=1.80;  VitD=40;  PSA=0.82   ~  January 19, 2014:  42mo ROV & CPX>> Russell Bullock reports a good interval w/o new complaints or concerns;  He was treated by TP recently for bronchitic infection w/ augmentin, Mucinex, Hydromet & improved;  We reviewed the following medical problems during today's office visit >>     AR/ AB> on Flonase & OTC antihist prn; finished recent  Augmentin, Mucinex, Hydromet; denies recurrent cough, sput, SOB, etc...    Chol> on Simva40; FLP 3/15 shows TChol 159, TG 131, HDL 43, LDL 90    Overweight> weight= 245#, BMI=35; we reviewed diet, exercise, weight reduction strategies...    GI- GERD, Divertics, Hems> on Zantac150; had colonoscopy 5/13 by DrPatterson w/ divertics, no polyps...    Hx kid stone, ED> on Viagra prn; denies LTOS etc...    Hx HA> uses OTC analgesics as needed...    Hx shingles and post herpetic neuralgia> prev trated w/ Lyrica & symptoms resolved...    Vit D deficiency> on VitD 50K/wk; Labs 3/15 showed Vit D = 35 & rec to continue same supplement...    Anxiety> on Valium2mg  prn...    Borderline Hg> labs 3/15 showed Hg= 12.6, MCV= 83; rec to take MVI w/ Fe or iron supplement... We reviewed prob list, meds, xrays and labs> see below for updates >>   CXR 3/15 showed norm heart size, clear lungs, but sl flat diaph noted & incr AP diam, NAD  LABS 3/15:  FLP- at goals on Simva40;  Chems- wnl x BS=117;  CBC- ok w/ Hb=12.6, MCV=83;  TSH=2.44;  VitD=35;  PSA=1.06;  UA- clear...            Problem List:    ALLERGIC RHINITIS (ICD-477.9) &  OTITIS MEDIA, LEFT (ICD-382.9) - uses OTC antihistamines + FLONASE... hx otitis media requiring ENT- w/ bilat tympanoplasties by DrCrossly in the past...  hx Vertigo w/ Rx Valium 2mg  in past- he had an extensive eval by [facility] DrMay w/ rec for [facility]  for Otolith disease... ~  9/14:  ENT eval by Barbaraann Faster showed AR & eustachian tube dysfunction; he had myringotomy tube in 2005 (perm tube) & was treated w/ Ciprodex for superimposed infection  Hx of ASTHMATIC BRONCHITIS, ACUTE (ICD-466.0) - he is a non-smoker w/ hx of recurrent bronchitic infections... ~  CXR 3/10 showed heart at upper lim norm, clear lungs, question sm HH, NAD.Marland Kitchen. ~  CXR 5/13 showed borderline heart size &tortuous Ao, sl hyperinflation, min degen spondylosis, clear & NAD.Marland Kitchen. ~  CXR 3/15 showed norm heart size, clear  lungs, but sl flat diaph noted & incr AP diam, NAD...   HYPERLIPIDEMIA (ICD-272.4) - on SIMVASTATIN 40mg /d... ~  FLP 3/10 on Simva40 showed TChol 145, TG 114, HDL 40, LDL 82... rec> continue same. ~  [place] 4/13 on Simva40 showed TChol 168, TG 143, HDL 45, LDL 94 ~  FLP 3/15 on Simva40 showed TChol 159, TG 131, HDL 43, LDL 90  GERD (ICD-530.81) - prev on Prilosec Prn, now uses OTC Ranitadine 150mg /d... ~  9/11: presented w/ 3wk hx RUQ pain> Labs all neg & Sonar- normal GB & ducts... ~  Abd Sonar 1/14 showed echogenic liver suggesting fatty infiltration, NAD...  HEMORRHOIDS (ICD-455.6) - colonoscopy 8/01 by DrPatterson was neg x for some min int hems... ~  5/13:  Colonoscopy by DrPatterson showed mod divertics, no polyps or lesions, f/u rec 10 yrs...  Hx of RENAL CALCULUS (ICD-592.0) - he had a kidney stone on the left requiring cystoscopic stone extraction in 1997 by DrRDavis et al... ~  Abd sonar 9/11 showed norm GB & ducts, bilat mild renal parenchymal thinning; sm left renal cyst, otherw neg...  HEADACHE (ICD-784.0) - eval by DrWeymann in 7/06... 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 of decr flow in right vertebral & ? 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 subseq 4 vessel cerebral angiogram by DrTDeveshwar was totally WNL.Marland KitchenMarland Kitchen  Hx of HERPES ZOSTER (ICD-053.9) & POSTHERPETIC NEURALGIA (ICD-053.19) - seen 8/08 with right V1shingles- facial/eye/scalp herpes zoster, treated with Valtrex and Prednisone taper... seen by Optometrist and recommend to use suppressive anti-virals for 6 month with acyclovir- labs revealed a negative HIV test, and postive HSV I/II IgM titer and negative HSV 2 IgG Ab... he required Lyrica for the postherpetic neuralgia and it resolved.  VITAMIN D DEFICIENCY (ICD-268.9) - on Vit D 50,000 u weekly... ~  Labs 3/10 showed  Vit D level = 18... Pt started vit D supplement ~  Labs 1/12 showed Vit D level = 23... Continue supplement ~  Labs 4/13 showed Vit D level = 40...  He remains on 50K weekly... ~  Labs 3/15 showed Vit d level = 35... rec to continue Vit D 50K weekly...  Health Maintenance - takes ASA 81mg /d... ~  Up to date on follow up colonoscopies... ~  labs 3/15 showed PSA= 1.06;  on Viagra Prn... ~  Immunizations:  encouraged to get yearly Flu vaccine each autumn.   Past Surgical History  Procedure Laterality Date  . Incision and drainage / excision thyroglossal cyst  1954  . Bilate tympanomastoid surgery    . Cystoscopic left renal stone extraction  1997    Dr Leander Rams  . Right  ureteroscopic stone extraction  2004    Dr Karsten Ro  . Colonoscopy      Outpatient Encounter Prescriptions as of 01/19/2014  Medication Sig  . aspirin 81 MG tablet Take 81 mg by mouth daily.  . diazepam (VALIUM) 2 MG tablet TAKE 1 TABLET EVERY DAY AS NEEDED  . fluocinonide (LIDEX) 0.05 % external solution Apply 1 application topically 2 (two) times daily.  . fluticasone (FLONASE) 50 MCG/ACT nasal spray USE 2 SPRAYS NASALLY DAILY  . ranitidine (ZANTAC) 150 MG tablet Take 150 mg by mouth at bedtime as needed.  . simvastatin (ZOCOR) 40 MG tablet TAKE 1 TABLET AT BEDTIME (PATIENT WILL NEED OFFICE VISIT WITH DR. Tami Blass FOR FURTHER REFILLS)  . Vitamin D, Ergocalciferol, (DRISDOL) 50000 UNITS CAPS capsule TAKE 1 CAPSULE EVERY 7 DAYS  . VIAGRA 100 MG tablet USE AS DIRECTED  . [DISCONTINUED] HYDROcodone-acetaminophen (NORCO/VICODIN) 5-325 MG per tablet Take 1 tablet by mouth every 6 (six) hours as needed for pain.  . [DISCONTINUED] HYDROcodone-homatropine (HYDROMET) 5-1.5 MG/5ML syrup Take 5 mLs by mouth every 6 (six) hours as needed for cough.    No Known Allergies   Current Medications, Allergies, Past Medical History, Past Surgical History, Family History, and Social History were reviewed in [employer] record.   Review of Systems         See HPI - all other systems neg except as noted...  The patient complains of hoarseness, dyspnea on  exertion, and prolonged cough.  The patient denies anorexia, fever, weight loss, weight gain, vision loss, decreased hearing, chest pain, syncope, peripheral edema, headaches, hemoptysis, abdominal pain, melena, hematochezia, severe indigestion/heartburn, hematuria, incontinence, muscle weakness, suspicious skin lesions, transient blindness, difficulty walking, depression, unusual weight change, abnormal bleeding, enlarged lymph nodes, and angioedema.     Objective:   Physical Exam    WD, WN, 63 y/o WM in NAD... GENERAL:  Alert & oriented; pleasant & cooperative... HEENT:  [place]/AT, EOM-wnl, PERRLA, Fundi-benign, EACs-Left ear tube in place, TMs-wnl,  NOSE-pale w/ clear discharge , THROAT-clear & wnl.   NECK:  Supple w/ full ROM; no JVD; normal carotid impulses w/o bruits; no thyromegaly or nodules palpated; no lymphadenopathy. CHEST:  Clear to P & A; without wheezes/ rales/ or rhonchi. HEART:  Regular Rhythm; without murmurs/ rubs/ or gallops. ABD: Soft, +RUQ tender on palp but costal margin is NOT tender, norm BS, no organomegaly or masses palp... EXT: w/o deformities, mild arthritic changes, no VV, mild VI, no edema... NEURO:  CN's intact, no focal neuro deficits... DERM:  no lesions seen...  RADIOLOGY DATA:  Reviewed in the EPIC EMR & discussed w/ the patient...  LABORATORY DATA:  Reviewed in the EPIC EMR & discussed w/ the patient...   Assessment:      AR/ AB>  Stable thru the allergy season on OTC antihist + FLONASE...  Hyperlipid>  FLP looks good- at goals- on Simva40 + diet; needs low chol, low fat diet & get the wt down...  GERD>  Stable on the Ranitadine, he says; ok to continue same...  Hx Divertics, Hemorrhoids>  He is up to date on Colon check etc...  Hx Kidney Stone>  Aware- no recurrent stones evident...  Headaches>  He uses OTC analgesics now just as needed...  Hx Shingles & post herpetic neuralgia>  This has resolved & he is back to baseline...  Hx Vit D deific>   He remains on Vit D 50K weekly...     Plan:  Patient's Medications  New Prescriptions   No medications on file  Previous Medications   ASPIRIN 81 MG TABLET    Take 81 mg by mouth daily.   DIAZEPAM (VALIUM) 2 MG TABLET    TAKE 1 TABLET EVERY DAY AS NEEDED   FLUOCINONIDE (LIDEX) 0.05 % EXTERNAL SOLUTION    Apply 1 application topically 2 (two) times daily.   FLUTICASONE (FLONASE) 50 MCG/ACT NASAL SPRAY    USE 2 SPRAYS NASALLY DAILY   RANITIDINE (ZANTAC) 150 MG TABLET    Take 150 mg by mouth at bedtime as needed.   SIMVASTATIN (ZOCOR) 40 MG TABLET    TAKE 1 TABLET AT BEDTIME (PATIENT WILL NEED OFFICE VISIT WITH DR. Alinah Sheard FOR FURTHER REFILLS)   VIAGRA 100 MG TABLET    USE AS DIRECTED   VITAMIN D, ERGOCALCIFEROL, (DRISDOL) 50000 UNITS CAPS CAPSULE    TAKE 1 CAPSULE EVERY 7 DAYS  Modified Medications   No medications on file  Discontinued Medications   HYDROCODONE-ACETAMINOPHEN (NORCO/VICODIN) 5-325 MG PER TABLET    Take 1 tablet by mouth every 6 (six) hours as needed for pain.   HYDROCODONE-HOMATROPINE (HYDROMET) 5-1.5 MG/5ML SYRUP    Take 5 mLs by mouth every 6 (six) hours as needed for cough.

## 2014-01-21 LAB — VITAMIN D 25 HYDROXY (VIT D DEFICIENCY, FRACTURES): VIT D 25 HYDROXY: 35 ng/mL (ref 30–89)

## 2014-01-24 ENCOUNTER — Other Ambulatory Visit: Payer: Self-pay | Admitting: Pulmonary Disease

## 2014-01-24 DIAGNOSIS — K649 Unspecified hemorrhoids: Secondary | ICD-10-CM

## 2014-01-24 DIAGNOSIS — K219 Gastro-esophageal reflux disease without esophagitis: Secondary | ICD-10-CM

## 2014-01-25 ENCOUNTER — Telehealth: Payer: Self-pay | Admitting: Gastroenterology

## 2014-01-25 NOTE — Telephone Encounter (Signed)
Reviewed chart and patient is due 2023. Last colon 2013. Left a message for patient to call me.

## 2014-01-25 NOTE — Telephone Encounter (Signed)
Spoke with patient and gave him the information about recall. He will inform Dr. Jeannine Kitten office of colonoscopy done in 2013.

## 2014-02-23 ENCOUNTER — Encounter: Payer: Self-pay | Admitting: Pulmonary Disease

## 2014-03-12 ENCOUNTER — Other Ambulatory Visit: Payer: Self-pay | Admitting: Pulmonary Disease

## 2014-04-26 ENCOUNTER — Encounter: Payer: Self-pay | Admitting: Physician Assistant

## 2014-04-26 ENCOUNTER — Ambulatory Visit (INDEPENDENT_AMBULATORY_CARE_PROVIDER_SITE_OTHER): Payer: Managed Care, Other (non HMO) | Admitting: Physician Assistant

## 2014-04-26 VITALS — BP 134/90 | HR 82 | Temp 99.4°F | Resp 16 | Ht 70.0 in | Wt 239.8 lb

## 2014-04-26 DIAGNOSIS — R300 Dysuria: Secondary | ICD-10-CM

## 2014-04-26 NOTE — Progress Notes (Signed)
Patient presents to clinic today c/o burning with urination that has been present for ~ 1 day.  Patient endorses changing soaps recently from Atlantic to Zest soap.  Patient denies fever, chills, malaise, fatigue.  Denies urgency, frequency, hematuria, back pain, nausea or vomiting.   Denies sexual intercourse.  Endorses some masturbation, but denies pain with masturbation or ejaculation.  Endorses some tenderness at the head of the penis. Denies penile discharge.  Denies testicular swelling or pain.   Past Medical History  Diagnosis Date  . Allergic rhinitis   . Asthmatic bronchitis   . Hyperlipidemia   . GERD (gastroesophageal reflux disease)   . IBS (irritable bowel syndrome)   . Hemorrhoids   . History of renal calculi   . Headache(784.0)   . History of herpes zoster   . Postherpetic neuralgia   . Vitamin D deficiency   . Seborrheic dermatitis    Current Outpatient Prescriptions on File Prior to Visit  Medication Sig Dispense Refill  . aspirin 81 MG tablet Take 81 mg by mouth daily.      . diazepam (VALIUM) 2 MG tablet TAKE 1 TABLET EVERY DAY AS NEEDED  90 tablet  1  . fluticasone (FLONASE) 50 MCG/ACT nasal spray USE 2 SPRAYS NASALLY DAILY  48 g  0  . ranitidine (ZANTAC) 150 MG tablet Take 150 mg by mouth at bedtime as needed.      . simvastatin (ZOCOR) 40 MG tablet TAKE 1 TABLET AT BEDTIME (PATIENT WILL NEED OFFICE VISIT WITH DR. NADEL FOR FURTHER REFILLS)  90 tablet  0  . Vitamin D, Ergocalciferol, (DRISDOL) 50000 UNITS CAPS capsule TAKE 1 CAPSULE EVERY 7 DAYS  12 capsule  0   No current facility-administered medications on file prior to visit.    No Known Allergies  Family History  Problem Relation Age of Onset  . Hypertension Mother   . Diabetes Mother   . Colon cancer Neg Hx   . Stomach cancer Neg Hx     History   Social History  . Marital Status: Single    Spouse Name: N/A    Number of Children: 0  . Years of Education: N/A   Occupational History  . Theatre stage manager for [employer] History Main Topics  . Smoking status: Never Smoker   . Smokeless tobacco: Never Used  . Alcohol Use: Yes     Comment: occasional  . Drug Use: No  . Sexual Activity: None   Other Topics Concern  . None   Social History Narrative  . None   Review of Systems - See HPI.  All other ROS are negative.  BP 134/90  Pulse 82  Temp(Src) 99.4 F (37.4 C) (Oral)  Resp 16  Ht 5\' 10"  (1.778 m)  Wt 239 lb 12 oz (108.75 kg)  BMI 34.40 kg/m2  SpO2 97%  Physical Exam  Vitals reviewed. Constitutional: He is oriented to person, place, and time and well-developed, well-nourished, and in no distress.  HENT:  Head: Normocephalic and atraumatic.  Eyes: Conjunctivae are normal.  Cardiovascular: Normal rate, regular rhythm, normal heart sounds and intact distal pulses.   Pulmonary/Chest: Effort normal and breath sounds normal. No respiratory distress. He has no wheezes. He has no rales. He exhibits no tenderness.  Abdominal: Soft. Bowel sounds are normal. He exhibits no distension and no mass. There is no tenderness. There is no rebound and no guarding.  Genitourinary: Testes/scrotum normal and penis normal. He exhibits no  epididymal tenderness. Cremasteric reflex is present. No discharge found.  Neurological: He is alert and oriented to person, place, and time.  Skin: Skin is warm and dry. No rash noted.  Psychiatric: Affect normal.   Assessment/Plan: Dysuria Physical exam unremarkable. Patient unable to give urine sample.  Patient sent home with cup to void in the AM and bring specimen directly to office for evaluation.  Possible symptoms are stemming to irritation from excess harsh soap.  Low concern for STD.  Need Urine to assess for UTI.

## 2014-04-26 NOTE — Patient Instructions (Signed)
Please bring urine specimen to office for analyzing.  We need this to help establish a diagnosis of a UTI, if present, as you do not have all of the classic UTI symptoms.

## 2014-04-26 NOTE — Assessment & Plan Note (Signed)
Physical exam unremarkable. Patient unable to give urine sample.  Patient sent home with cup to void in the AM and bring specimen directly to office for evaluation.  Possible symptoms are stemming to irritation from excess harsh soap.  Low concern for STD.  Need Urine to assess for UTI.

## 2014-04-26 NOTE — Progress Notes (Signed)
Pre visit review using our clinic review tool, if applicable. No additional management support is needed unless otherwise documented below in the visit note/SLS  

## 2014-04-27 ENCOUNTER — Telehealth: Payer: Self-pay | Admitting: Physician Assistant

## 2014-04-27 LAB — POCT URINALYSIS DIPSTICK
BILIRUBIN UA: NEGATIVE
Blood, UA: NEGATIVE
Glucose, UA: NEGATIVE
LEUKOCYTES UA: NEGATIVE
Nitrite, UA: NEGATIVE
PROTEIN UA: NEGATIVE
SPEC GRAV UA: 1.015
Urobilinogen, UA: 0.2
pH, UA: 6

## 2014-04-27 NOTE — Telephone Encounter (Signed)
Patient only brought Urine in today, he will not have results today/SLS

## 2014-04-27 NOTE — Addendum Note (Signed)
Addended by: Rockwell Germany on: 04/27/2014 09:09 AM   Modules accepted: Orders

## 2014-04-27 NOTE — Telephone Encounter (Signed)
FYI

## 2014-04-27 NOTE — Telephone Encounter (Signed)
Patient is requesting urine results

## 2014-04-28 LAB — CULTURE, URINE COMPREHENSIVE
COLONY COUNT: NO GROWTH
ORGANISM ID, BACTERIA: NO GROWTH

## 2014-04-28 LAB — GC/CHLAMYDIA PROBE AMP, URINE
Chlamydia, Swab/Urine, PCR: NEGATIVE
GC Probe Amp, Urine: NEGATIVE

## 2014-05-01 ENCOUNTER — Encounter: Payer: Self-pay | Admitting: Family

## 2014-05-01 ENCOUNTER — Ambulatory Visit (INDEPENDENT_AMBULATORY_CARE_PROVIDER_SITE_OTHER): Payer: Managed Care, Other (non HMO) | Admitting: Family

## 2014-05-01 VITALS — BP 140/80 | HR 86 | Temp 98.4°F | Resp 16 | Ht 70.0 in | Wt 241.0 lb

## 2014-05-01 DIAGNOSIS — N489 Disorder of penis, unspecified: Secondary | ICD-10-CM

## 2014-05-01 DIAGNOSIS — N4889 Other specified disorders of penis: Secondary | ICD-10-CM

## 2014-05-01 DIAGNOSIS — R300 Dysuria: Secondary | ICD-10-CM

## 2014-05-01 MED ORDER — CLOTRIMAZOLE 1 % EX CREA: 1.0000 "application " | TOPICAL_CREAM | Freq: Two times a day (BID) | CUTANEOUS | Status: DC

## 2014-05-01 NOTE — Progress Notes (Signed)
Pre visit review using our clinic review tool, if applicable. No additional management support is needed unless otherwise documented below in the visit note. 

## 2014-05-01 NOTE — Progress Notes (Signed)
Subjective:    Patient ID: RAMONA SLINGER, male    DOB: 10-Jul-1951, 62 y.o.   MRN: [id number]  HPI  Mr. Zeitz is a 63 yr old male who presents today with chief complaint of dysuria. Was seen last week by Elyn Aquas PA-C (on 04/26/14) .  Urine culture was negative, GC/Chlamydia testing was also negative. Called RN over the weekend and rx was provided for cipro for possible prostatitis and he was advised to follow up with [place].   He does report that it is easier to urinate in a seated position than it is to urinate standing up.   He reports some left lower abdominal pressure.  Noted a bump on scrotum and on the base of the penile shaft.  Reports areas have improved. Has been applying calamine lotion.    Lab Results  Component Value Date   PSA 1.06 01/18/2014   PSA 0.82 02/20/2012   PSA 0.71 01/11/2009    Review of Systems See HPI  Past Medical History  Diagnosis Date  . Allergic rhinitis   . Asthmatic bronchitis   . Hyperlipidemia   . GERD (gastroesophageal reflux disease)   . IBS (irritable bowel syndrome)   . Hemorrhoids   . History of renal calculi   . Headache(784.0)   . History of herpes zoster   . Postherpetic neuralgia   . Vitamin D deficiency   . Seborrheic dermatitis     History   Social History  . Marital Status: Single    Spouse Name: N/A    Number of Children: 0  . Years of Education: N/A   Occupational History  . Nurse, learning disability for [employer] History Main Topics  . Smoking status: Never Smoker   . Smokeless tobacco: Never Used  . Alcohol Use: Yes     Comment: occasional  . Drug Use: No  . Sexual Activity: Not on file   Other Topics Concern  . Not on file   Social History Narrative  . No narrative on file    Past Surgical History  Procedure Laterality Date  . Incision and drainage / excision thyroglossal cyst  1954  . Bilate tympanomastoid surgery    . Cystoscopic left renal stone extraction  1997    Dr Leander Rams  . Right  ureteroscopic stone extraction  2004    Dr Karsten Ro  . Colonoscopy      Family History  Problem Relation Age of Onset  . Hypertension Mother   . Diabetes Mother   . Colon cancer Neg Hx   . Stomach cancer Neg Hx     No Known Allergies  Current Outpatient Prescriptions on File Prior to Visit  Medication Sig Dispense Refill  . aspirin 81 MG tablet Take 81 mg by mouth daily.      . diazepam (VALIUM) 2 MG tablet TAKE 1 TABLET EVERY DAY AS NEEDED  90 tablet  1  . fluticasone (FLONASE) 50 MCG/ACT nasal spray USE 2 SPRAYS NASALLY DAILY  48 g  0  . ranitidine (ZANTAC) 150 MG tablet Take 150 mg by mouth at bedtime as needed.      . simvastatin (ZOCOR) 40 MG tablet TAKE 1 TABLET AT BEDTIME (PATIENT WILL NEED OFFICE VISIT WITH DR. NADEL FOR FURTHER REFILLS)  90 tablet  0  . Vitamin D, Ergocalciferol, (DRISDOL) 50000 UNITS CAPS capsule TAKE 1 CAPSULE EVERY 7 DAYS  12 capsule  0   No current facility-administered medications on file prior  to visit.    BP 140/80  Pulse 86  Temp(Src) 98.4 F (36.9 C) (Oral)  Resp 16  Ht 5\' 10"  (1.778 m)  Wt 241 lb (109.317 kg)  BMI 34.58 kg/m2  SpO2 98%       Objective:   Physical Exam  Constitutional: He is oriented to person, place, and time. He appears well-developed and well-nourished. No distress.  HENT:  Head: Normocephalic and atraumatic.  Cardiovascular: Normal rate and regular rhythm.   No murmur heard. Pulmonary/Chest: Effort normal and breath sounds normal. No respiratory distress. He has no wheezes. He has no rales. He exhibits no tenderness.  Genitourinary:  Small ulcerated lesion about 2 mm in diameter on the penile shaft.  Small area of mild erythema on scrotum,  Appears to be resolving folliculitis  Musculoskeletal: He exhibits no edema.  Neurological: He is alert and oriented to person, place, and time.  Psychiatric: He has a normal mood and affect. His behavior is normal. Judgment and thought content normal.            Assessment & Plan:

## 2014-05-01 NOTE — Patient Instructions (Addendum)
Schedule follow up with Dr. Karsten Ro to discuss your urinary concerns.   Complete lab work prior to leaving. Complete cipro. Apply clotrimazole cream twice daily to penis.  Call if symptoms do not improve.

## 2014-05-02 ENCOUNTER — Telehealth: Payer: Self-pay | Admitting: *Deleted

## 2014-05-02 ENCOUNTER — Other Ambulatory Visit: Payer: Self-pay | Admitting: Pulmonary Disease

## 2014-05-02 LAB — HSV(HERPES SIMPLEX VRS) I + II AB-IGM: HERPES SIMPLEX VRS I-IGM AB (EIA): 0.35 {index}

## 2014-05-02 LAB — RPR

## 2014-05-02 NOTE — Telephone Encounter (Signed)
Pt seen 05/01/14 and concerns addressed.  [facility] Triage Call Report Triage Record Num: [id number] Operator: Doug Sou Patient Name: Russell Bullock Call Date & Time: 04/28/2014 7:55:18PM Patient Phone: [phone] PCP: Gwyndolyn Saxon 'Campbell Patient Gender: Male PCP Fax : [phone] Patient DOB: 1951/08/07 Practice Name: [facility] Reason for Call: Caller: Daymion/Patient; PCP: Leeanne Rio"; CB#: [phone]; Call regarding burning with urination. Started on 04/25/14. States he went to the office on 04/26/14 and took a urine specimen to the office on 04/27/14. Is complaining of left lower quadrant abdomen and back pain. Had chills around 4:30 pm on 04/28/14 with fever of 99.8 on 04/28/14. History of kidney stones, but states this is different. Triaged per Urinary Symptoms-Male guideline. To see provider within 24 hours due to urinary tract symptoms and any flank or low back, penis or scrotal pain. Care advice given. Discussed with Dr. Glori Bickers and she stated it may be a prostate infection and to call in Cipro 500 mg to take 1 po BID x 7 days. Dispense 14 with no refills. To followup with his Dr. next week and if worsens to go to the ED or urgent care. Caller made aware and agrees and requests rx be called in to [facility] at [phone] and rx called in. Protocol(s) Used: Urinary Symptoms - Male Recommended Outcome per Protocol: See Provider within 24 hours Reason for Outcome: Urinary tract symptoms AND any flank or low back, penis or scrotal pain Care Advice: ~ Call provider if symptoms worsen, such as increasing pain in low back, pelvis, or side(s); blood in urine; or fever. ~ IMMEDIATE ACTION ~ CAUTIONS Increase intake of fluids. Try to drink 8 oz. (.2 liter) every hour when awake, unless on restricted fluids for other medical reasons. Include at least two 8 oz. (.2 liter) glasses of unsweetened cranberry juice each day. Take sips of fluid or eat ice chips if nauseated or  vomiting.

## 2014-05-02 NOTE — Telephone Encounter (Signed)
Mychart message sent from pt: Message     Original authorizing provider: Noralee Space, MD        Russell Bullock would like a refill of the following medications:    simvastatin (ZOCOR) 40 MG tablet Noralee Space, MD]        Preferred pharmacy: [facility], [place]    Pt is now established with Leeanne Rio on 7.1.15 and then an acute visit on 7.6.15. Will deny refill and defer to pt's new PCP

## 2014-05-03 ENCOUNTER — Encounter: Payer: Self-pay | Admitting: Family

## 2014-05-03 LAB — HSV 1 ANTIBODY, IGG

## 2014-05-03 LAB — HSV 2 ANTIBODY, IGG: HSV 2 Glycoprotein G Ab, IgG: <0.1 IV

## 2014-05-04 ENCOUNTER — Other Ambulatory Visit: Payer: Self-pay | Admitting: Family

## 2014-05-04 DIAGNOSIS — N489 Disorder of penis, unspecified: Secondary | ICD-10-CM | POA: Insufficient documentation

## 2014-05-04 NOTE — Telephone Encounter (Signed)
Please let pt know that I would like to extend his cipro x 14 days total for possible prostate infection.  An additional 7 days has been sent to his pharmacy. Call if pelvic pressure, urinary symptoms are not resolved at time of completion. Please verify pt's pharmacy.

## 2014-05-04 NOTE — Assessment & Plan Note (Signed)
HSV and RPR negative.  Advised pt to let me know if symptoms do not continue to improve.

## 2014-05-04 NOTE — Assessment & Plan Note (Signed)
Suspect prostatitis.  Will extend cipro x 14 days.

## 2014-05-05 ENCOUNTER — Telehealth: Payer: Self-pay | Admitting: Physician Assistant

## 2014-05-05 ENCOUNTER — Telehealth: Payer: Self-pay | Admitting: Family

## 2014-05-05 DIAGNOSIS — N410 Acute prostatitis: Secondary | ICD-10-CM

## 2014-05-05 MED ORDER — CIPROFLOXACIN HCL 500 MG PO TABS: 500.0000 mg | ORAL_TABLET | Freq: Two times a day (BID) | ORAL | Status: DC

## 2014-05-05 NOTE — Telephone Encounter (Signed)
I have sent in additional 7 day course.  Melissa had planned to extend his course but the medicine did not send.  I have [facility] [place] [facility] he [facility].

## 2014-05-05 NOTE — Telephone Encounter (Signed)
See 7/10 phone note.

## 2014-05-05 NOTE — Telephone Encounter (Signed)
LMOM with contact name and number for return call RE: Rx and further provider instructions/SLS

## 2014-05-05 NOTE — Telephone Encounter (Signed)
Left message to return my call.  

## 2014-05-05 NOTE — Telephone Encounter (Signed)
Patient called in stating that he is feeling somewhat better but is still having the symptoms. He has appointment with urologist later next week and would like to know if he could have another refill of cipro sent to [facility] in [place] on [address]

## 2014-05-05 NOTE — Telephone Encounter (Signed)
Left message for pt to return my call.

## 2014-05-05 NOTE — Telephone Encounter (Signed)
Notified pt. 

## 2014-05-05 NOTE — Telephone Encounter (Signed)
Herpes screening and syphilis testing are negative.

## 2014-05-25 ENCOUNTER — Ambulatory Visit (INDEPENDENT_AMBULATORY_CARE_PROVIDER_SITE_OTHER): Payer: Managed Care, Other (non HMO) | Admitting: Physician Assistant

## 2014-05-25 ENCOUNTER — Encounter: Payer: Self-pay | Admitting: Physician Assistant

## 2014-05-25 VITALS — BP 128/88 | HR 92 | Temp 98.7°F | Resp 16 | Ht 70.0 in | Wt 238.5 lb

## 2014-05-25 DIAGNOSIS — Z2911 Encounter for prophylactic immunotherapy for respiratory syncytial virus (RSV): Secondary | ICD-10-CM

## 2014-05-25 DIAGNOSIS — H8110 Benign paroxysmal vertigo, unspecified ear: Secondary | ICD-10-CM

## 2014-05-25 DIAGNOSIS — E785 Hyperlipidemia, unspecified: Secondary | ICD-10-CM

## 2014-05-25 DIAGNOSIS — J309 Allergic rhinitis, unspecified: Secondary | ICD-10-CM

## 2014-05-25 DIAGNOSIS — Z23 Encounter for immunization: Secondary | ICD-10-CM

## 2014-05-25 DIAGNOSIS — K219 Gastro-esophageal reflux disease without esophagitis: Secondary | ICD-10-CM

## 2014-05-25 MED ORDER — SIMVASTATIN 40 MG PO TABS: ORAL_TABLET | ORAL | Status: DC

## 2014-05-25 MED ORDER — SILDENAFIL CITRATE 50 MG PO TABS: 25.0000 mg | ORAL_TABLET | Freq: Every day | ORAL | Status: DC | PRN

## 2014-05-25 NOTE — Progress Notes (Signed)
Patient presents to clinic today for follow-up of chronic issues.  Patient was seen at new patient appointment for acute concerns.  Was to come back to formally establish.  Patient with recent CPE in 12/2013.  Acute Concerns: No acute concerns today.  Chronic Issues: Hyperlipidemia -- Currently on Zocor 40 mg daily.  Denies myalgia or arthralgias. Recent labs reveal good control of cholesterol without evidence of hepatic impairment.  Vertigo -- Chronic with intermittent flare-ups.  Has Rx for Valium to have on hand if symptoms flare-up.  Allergic Rhinitis -- Good relief with Flonase.  GERD -- Occasional symptoms.  Good relief with occasional Zantac.  Denies N/V, abdominal pain.   Health Maintenance: Dental -- up-to-date Vision -- up-to-date Immunizations -- Tetanus due.  Due for Zostavax. Colonoscopy -- Due in 2023.    Past Medical History  Diagnosis Date  . Allergic rhinitis   . Asthmatic bronchitis   . Hyperlipidemia   . GERD (gastroesophageal reflux disease)   . IBS (irritable bowel syndrome)   . Hemorrhoids   . History of renal calculi   . Headache(784.0)   . History of herpes zoster   . Postherpetic neuralgia   . Vitamin D deficiency   . Seborrheic dermatitis     Past Surgical History  Procedure Laterality Date  . Incision and drainage / excision thyroglossal cyst  1954  . Bilate tympanomastoid surgery    . Cystoscopic left renal stone extraction  1997    Dr Leander Rams  . Right ureteroscopic stone extraction  2004    Dr Karsten Ro  . Colonoscopy      Current Outpatient Prescriptions on File Prior to Visit  Medication Sig Dispense Refill  . aspirin 81 MG tablet Take 81 mg by mouth daily.      . clotrimazole (LOTRIMIN) 1 % cream Apply 1 application topically 2 (two) times daily.  30 g  0  . diazepam (VALIUM) 2 MG tablet TAKE 1 TABLET EVERY DAY AS NEEDED  90 tablet  1  . fluticasone (FLONASE) 50 MCG/ACT nasal spray USE 2 SPRAYS NASALLY DAILY  48 g  0  . ranitidine  (ZANTAC) 150 MG tablet Take 150 mg by mouth at bedtime as needed.      . Vitamin D, Ergocalciferol, (DRISDOL) 50000 UNITS CAPS capsule TAKE 1 CAPSULE EVERY 7 DAYS  12 capsule  0   No current facility-administered medications on file prior to visit.    No Known Allergies  Family History  Problem Relation Age of Onset  . Hypertension Mother   . Diabetes Mother   . Colon cancer Neg Hx   . Stomach cancer Neg Hx   . Arthritis Mother   . Lung cancer Father 53    Deceased  . Hyperlipidemia Mother   . Mental illness Mother   . Arthritis Maternal Grandmother   . Alcoholism Paternal Grandfather     History   Social History  . Marital Status: Single    Spouse Name: N/A    Number of Children: 0  . Years of Education: N/A   Occupational History  . Nurse, learning disability for [employer] History Main Topics  . Smoking status: Never Smoker   . Smokeless tobacco: Never Used  . Alcohol Use: Yes     Comment: occasional  . Drug Use: No  . Sexual Activity: Yes     Comment: with one male partner    Other Topics Concern  . Not on file   Social History  Narrative  . No narrative on file   Review of Systems  Constitutional: Negative for fever and weight loss.  Cardiovascular: Negative for chest pain and palpitations.  Gastrointestinal: Positive for heartburn. Negative for nausea, vomiting, abdominal pain, diarrhea, constipation, blood in stool and melena.  Genitourinary: Negative for dysuria, urgency, frequency, hematuria and flank pain.   BP 128/88  Pulse 92  Temp(Src) 98.7 F (37.1 C) (Oral)  Resp 16  Ht 5\' 10"  (1.778 m)  Wt 238 lb 8 oz (108.183 kg)  BMI 34.22 kg/m2  SpO2 98%  Physical Exam  Vitals reviewed. Constitutional: He is oriented to person, place, and time and well-developed, well-nourished, and in no distress.  HENT:  Head: Normocephalic and atraumatic.  Right Ear: External ear normal.  Left Ear: External ear normal.  Nose: Nose normal.   Mouth/Throat: Oropharynx is clear and moist. No oropharyngeal exudate.  Eyes: Conjunctivae are normal.  Neck: Neck supple.  Cardiovascular: Normal rate, regular rhythm, normal heart sounds and intact distal pulses.   Pulmonary/Chest: Effort normal and breath sounds normal. No respiratory distress. He has no wheezes. He has no rales. He exhibits no tenderness.  Neurological: He is alert and oriented to person, place, and time.  Skin: Skin is warm and dry. No rash noted.  Psychiatric: Affect normal.    Recent Results (from the past 2160 hour(s))  CULTURE, URINE COMPREHENSIVE     Status: None   Collection Time    04/27/14  9:00 AM      Result Value Ref Range   Colony Count NO GROWTH     Organism ID, Bacteria NO GROWTH    GC/CHLAMYDIA PROBE AMP, URINE     Status: None   Collection Time    04/27/14  9:00 AM      Result Value Ref Range   Chlamydia, Swab/Urine, PCR NEGATIVE  NEGATIVE   Comment:                          **Normal Reference Range: Negative**                  Assay performed using the Gen-Probe APTIMA COMBO2 (R) Assay.         GC Probe Amp, Urine NEGATIVE  NEGATIVE   Comment:                          **Normal Reference Range: Negative**                  Assay performed using the Gen-Probe APTIMA COMBO2 (R) Assay.        POCT URINALYSIS DIPSTICK     Status: None   Collection Time    04/27/14  9:00 AM      Result Value Ref Range   Color, UA straw     Clarity, UA cloudy     Glucose, UA neg     Bilirubin, UA neg     Ketones, UA trace     Spec Grav, UA 1.015     Blood, UA neg     pH, UA 6.0     Protein, UA neg     Urobilinogen, UA 0.2     Nitrite, UA neg     Leukocytes, UA Negative    HSV 1 ANTIBODY, IGG     Status: None   Collection Time    05/01/14  4:25 PM      Result Value  Ref Range   HSV 1 Glycoprotein G Ab, IgG <0.10     Comment:          IV = Index Value                  < 0.90 IV              Negative                  0.90-1.10 IV            Equivocal                  > 1.10 IV              Positive  HSV 2 ANTIBODY, IGG     Status: None   Collection Time    05/01/14  4:25 PM      Result Value Ref Range   HSV 2 Glycoprotein G Ab, IgG <0.10     Comment:          IV = Index Value                  < 0.90 IV              Negative                  0.90-1.10 IV           Equivocal                  > 1.10 IV              Positive  HSV(HERPES SIMPLEX VRS) I + II AB-IGM     Status: None   Collection Time    05/01/14  4:25 PM      Result Value Ref Range   Herpes Simplex Vrs I&II-IgM Ab (EIA) 0.35     Comment:                     <=0.90 . . . . . . Marland Kitchen NEGATIVE                   0.91-1.09. . . . . Marland Kitchen EQUIVOCAL                   >=1.10 . . . . . . Marland Kitchen POSITIVE                                                                                                           The results obtained with the HSV 1      an aid to diagnosis and should not be interpreted as diagnostic by     themselves. Heterotypic IgM antibody responses may occur in patients     with a history of infection with other Herpes viruses, including     Epstein-Barr virus and Varicella zoster virus, and give false positive     results in HSV 1      between HSV 1  and HSV 2.  A positive HSV IgM may indicate primary     infection, but IgM antibody can persist 12 or more months after     primary infection.  For confirmation, if clinically indicated,     positive IgM results could be followed with the test for HSV 1      glycoprotein (test code [phone]) in 4-6 weeks.  RPR     Status: None   Collection Time    05/01/14  4:25 PM      Result Value Ref Range   RPR NON REAC  NON REAC    Assessment/Plan: Need for prophylactic vaccination with tetanus-diphtheria (TD) Immunization given by nursing staff.  Need for prophylactic vaccination and inoculation against other viral diseases(V04.89) Zostvax given by nursing staff.  HYPERLIPIDEMIA Doing well on Zocor. No myalgias.  Recent  labs reveal good control. Continue current regimen.   ALLERGIC RHINITIS Continue FLonase.   GERD Occasional symptoms with poor dietary choices.  Continue PRN Zantac.  Encouraged weight loss.  Avoid late-night eating and trigger foods.  Elevate HOB.   BENIGN PAROXYSMAL POSITIONAL VERTIGO Rare flare-ups for which he has Rx for Valium to take at symptom onset.  No concerns at present.  Continue current regimen.  Watch salt intake.

## 2014-05-25 NOTE — Progress Notes (Signed)
Pre visit review using our clinic review tool, if applicable. No additional management support is needed unless otherwise documented below in the visit note/SLS  

## 2014-05-25 NOTE — Patient Instructions (Signed)
Please continue your medications as directed.  I have sent in a refill of your cholesterol medication to the pharmacy.  We will follow-up in 6 months concerning your cholesterol.  For erectile function -- Take 1/2-1 tablet of Viagra 30 minutes - 1 hour prior to intercourse.  Do not take more than 1 dose in a 24-hour period.  Stay well hydrated while taking medication.  Preventive Care for Adults A healthy lifestyle and preventive care can promote health and wellness. Preventive health guidelines for men include the following key practices:  A routine yearly physical is a good way to check with your health care provider about your health and preventative screening. It is a chance to share any concerns and updates on your health and to receive a thorough exam.  Visit your dentist for a routine exam and preventative care every 6 months. Brush your teeth twice a day and floss once a day. Good oral hygiene prevents tooth decay and gum disease.  The frequency of eye exams is based on your age, health, family medical history, use of contact lenses, and other factors. Follow your health care provider's recommendations for frequency of eye exams.  Eat a healthy diet. Foods such as vegetables, fruits, whole grains, low-fat dairy products, and lean protein foods contain the nutrients you need without too many calories. Decrease your intake of foods high in solid fats, added sugars, and salt. Eat the right amount of calories for you.Get information about a proper diet from your health care provider, if necessary.  Regular physical exercise is one of the most important things you can do for your health. Most adults should get at least 150 minutes of moderate-intensity exercise (any activity that increases your heart rate and causes you to sweat) each week. In addition, most adults need muscle-strengthening exercises on 2 or more days a week.  Maintain a healthy weight. The body mass index (BMI) is a screening  tool to identify possible weight problems. It provides an estimate of body fat based on height and weight. Your health care provider can find your BMI and can help you achieve or maintain a healthy weight.For adults 20 years and older:  A BMI below 18.5 is considered underweight.  A BMI of 18.5 to 24.9 is normal.  A BMI of 25 to 29.9 is considered overweight.  A BMI of 30 and above is considered obese.  Maintain normal blood lipids and cholesterol levels by exercising and minimizing your intake of saturated fat. Eat a balanced diet with plenty of fruit and vegetables. Blood tests for lipids and cholesterol should begin at age 47 and be repeated every 5 years. If your lipid or cholesterol levels are high, you are over 50, or you are at high risk for heart disease, you may need your cholesterol levels checked more frequently.Ongoing high lipid and cholesterol levels should be treated with medicines if diet and exercise are not working.  If you smoke, find out from your health care provider how to quit. If you do not use tobacco, do not start.  Lung cancer screening is recommended for adults aged 42-80 years who are at high risk for developing lung cancer because of a history of smoking. A yearly low-dose CT scan of the lungs is recommended for people who have at least a 30-pack-year history of smoking and are a current smoker or have quit within the past 15 years. A pack year of smoking is smoking an average of 1 pack of cigarettes a day  for 1 year (for example: 1 pack a day for 30 years or 2 packs a day for 15 years). Yearly screening should continue until the smoker has stopped smoking for at least 15 years. Yearly screening should be stopped for people who develop a health problem that would prevent them from having lung cancer treatment.  If you choose to drink alcohol, do not have more than 2 drinks per day. One drink is considered to be 12 ounces (355 mL) of beer, 5 ounces (148 mL) of wine, or  1.5 ounces (44 mL) of liquor.  Avoid use of street drugs. Do not share needles with anyone. Ask for help if you need support or instructions about stopping the use of drugs.  High blood pressure causes heart disease and increases the risk of stroke. Your blood pressure should be checked at least every 1-2 years. Ongoing high blood pressure should be treated with medicines, if weight loss and exercise are not effective.  If you are 82-61 years old, ask your health care provider if you should take aspirin to prevent heart disease.  Diabetes screening involves taking a blood sample to check your fasting blood sugar level. This should be done once every 3 years, after age 29, if you are within normal weight and without risk factors for diabetes. Testing should be considered at a younger age or be carried out more frequently if you are overweight and have at least 1 risk factor for diabetes.  Colorectal cancer can be detected and often prevented. Most routine colorectal cancer screening begins at the age of 69 and continues through age 81. However, your health care provider may recommend screening at an earlier age if you have risk factors for colon cancer. On a yearly basis, your health care provider may provide home test kits to check for hidden blood in the stool. Use of a small camera at the end of a tube to directly examine the colon (sigmoidoscopy or colonoscopy) can detect the earliest forms of colorectal cancer. Talk to your health care provider about this at age 41, when routine screening begins. Direct exam of the colon should be repeated every 5-10 years through age 35, unless early forms of precancerous polyps or small growths are found.  People who are at an increased risk for hepatitis B should be screened for this virus. You are considered at high risk for hepatitis B if:  You were born in a country where hepatitis B occurs often. Talk with your health care provider about which countries are  considered high risk.  Your parents were born in a high-risk country and you have not received a shot to protect against hepatitis B (hepatitis B vaccine).  You have HIV or AIDS.  You use needles to inject street drugs.  You live with, or have sex with, someone who has hepatitis B.  You are a man who has sex with other men (MSM).  You get hemodialysis treatment.  You take certain medicines for conditions such as cancer, organ transplantation, and autoimmune conditions.  Hepatitis C blood testing is recommended for all people born from [birth year] through 1965 and any individual with known risks for hepatitis C.  Practice safe sex. Use condoms and avoid high-risk sexual practices to reduce the spread of sexually transmitted infections (STIs). STIs include gonorrhea, chlamydia, syphilis, trichomonas, herpes, HPV, and human immunodeficiency virus (HIV). Herpes, HIV, and HPV are viral illnesses that have no cure. They can result in disability, cancer, and death.  If you  are at risk of being infected with HIV, it is recommended that you take a prescription medicine daily to prevent HIV infection. This is called preexposure prophylaxis (PrEP). You are considered at risk if:  You are a man who has sex with other men (MSM) and have other risk factors.  You are a heterosexual man, are sexually active, and are at increased risk for HIV infection.  You take drugs by injection.  You are sexually active with a partner who has HIV.  Talk with your health care provider about whether you are at high risk of being infected with HIV. If you choose to begin PrEP, you should first be tested for HIV. You should then be tested every 3 months for as long as you are taking PrEP.  A one-time screening for abdominal aortic aneurysm (AAA) and surgical repair of large AAAs by ultrasound are recommended for men ages 61 to 42 years who are current or former smokers.  Healthy men should no longer receive  prostate-specific antigen (PSA) blood tests as part of routine cancer screening. Talk with your health care provider about prostate cancer screening.  Testicular cancer screening is not recommended for adult males who have no symptoms. Screening includes self-exam, a health care provider exam, and other screening tests. Consult with your health care provider about any symptoms you have or any concerns you have about testicular cancer.  Use sunscreen. Apply sunscreen liberally and repeatedly throughout the day. You should seek shade when your shadow is shorter than you. Protect yourself by wearing long sleeves, pants, a wide-brimmed hat, and sunglasses year round, whenever you are outdoors.  Once a month, do a whole-body skin exam, using a mirror to look at the skin on your back. Tell your health care provider about new moles, moles that have irregular borders, moles that are larger than a pencil eraser, or moles that have changed in shape or color.  Stay current with required vaccines (immunizations).  Influenza vaccine. All adults should be immunized every year.  Tetanus, diphtheria, and acellular pertussis (Td, Tdap) vaccine. An adult who has not previously received Tdap or who does not know his vaccine status should receive 1 dose of Tdap. This initial dose should be followed by tetanus and diphtheria toxoids (Td) booster doses every 10 years. Adults with an unknown or incomplete history of completing a 3-dose immunization series with Td-containing vaccines should begin or complete a primary immunization series including a Tdap dose. Adults should receive a Td booster every 10 years.  Varicella vaccine. An adult without evidence of immunity to varicella should receive 2 doses or a second dose if he has previously received 1 dose.  Human papillomavirus (HPV) vaccine. Males aged 60-21 years who have not received the vaccine previously should receive the 3-dose series. Males aged 22-26 years may be  immunized. Immunization is recommended through the age of 43 years for any male who has sex with males and did not get any or all doses earlier. Immunization is recommended for any person with an immunocompromised condition through the age of 34 years if he did not get any or all doses earlier. During the 3-dose series, the second dose should be obtained 4-8 weeks after the first dose. The third dose should be obtained 24 weeks after the first dose and 16 weeks after the second dose.  Zoster vaccine. One dose is recommended for adults aged 32 years or older unless certain conditions are present.  Measles, mumps, and rubella (MMR) vaccine. Adults  born before 64 generally are considered immune to measles and mumps. Adults born in [birth year] or later should have 1 or more doses of MMR vaccine unless there is a contraindication to the vaccine or there is laboratory evidence of immunity to each of the three diseases. A routine second dose of MMR vaccine should be obtained at least 28 days after the first dose for students attending postsecondary schools, health care workers, or international travelers. People who received inactivated measles vaccine or an unknown type of measles vaccine during 1963-1967 should receive 2 doses of MMR vaccine. People who received inactivated mumps vaccine or an unknown type of mumps vaccine before 1979 and are at high risk for mumps infection should consider immunization with 2 doses of MMR vaccine. Unvaccinated health care workers born before [birth year] who lack laboratory evidence of measles, mumps, or rubella immunity or laboratory confirmation of disease should consider measles and mumps immunization with 2 doses of MMR vaccine or rubella immunization with 1 dose of MMR vaccine.  Pneumococcal 13-valent conjugate (PCV13) vaccine. When indicated, a person who is uncertain of his immunization history and has no record of immunization should receive the PCV13 vaccine. An adult aged 60 years or  older who has certain medical conditions and has not been previously immunized should receive 1 dose of PCV13 vaccine. This PCV13 should be followed with a dose of pneumococcal polysaccharide (PPSV23) vaccine. The PPSV23 vaccine dose should be obtained at least 8 weeks after the dose of PCV13 vaccine. An adult aged 50 years or older who has certain medical conditions and previously received 1 or more doses of PPSV23 vaccine should receive 1 dose of PCV13. The PCV13 vaccine dose should be obtained 1 or more years after the last PPSV23 vaccine dose.  Pneumococcal polysaccharide (PPSV23) vaccine. When PCV13 is also indicated, PCV13 should be obtained first. All adults aged 38 years and older should be immunized. An adult younger than age 30 years who has certain medical conditions should be immunized. Any person who resides in a nursing home or long-term care facility should be immunized. An adult smoker should be immunized. People with an immunocompromised condition and certain other conditions should receive both PCV13 and PPSV23 vaccines. People with human immunodeficiency virus (HIV) infection should be immunized as soon as possible after diagnosis. Immunization during chemotherapy or radiation therapy should be avoided. Routine use of PPSV23 vaccine is not recommended for American Indians, [place] Natives, or people younger than 65 years unless there are medical conditions that require PPSV23 vaccine. When indicated, people who have unknown immunization and have no record of immunization should receive PPSV23 vaccine. One-time revaccination 5 years after the first dose of PPSV23 is recommended for people aged 19-64 years who have chronic kidney failure, nephrotic syndrome, asplenia, or immunocompromised conditions. People who received 1-2 doses of PPSV23 before age 34 years should receive another dose of PPSV23 vaccine at age 63 years or later if at least 5 years have passed since the previous dose. Doses of  PPSV23 are not needed for people immunized with PPSV23 at or after age 53 years.  Meningococcal vaccine. Adults with asplenia or persistent complement component deficiencies should receive 2 doses of quadrivalent meningococcal conjugate (MenACWY-D) vaccine. The doses should be obtained at least 2 months apart. Microbiologists working with certain meningococcal bacteria, [employer] recruits, people at risk during an outbreak, and people who travel to or live in countries with a high rate of meningitis should be immunized. A first-year college student up through age  21 years who is living in a residence hall should receive a dose if he did not receive a dose on or after his 16th birthday. Adults who have certain high-risk conditions should receive one or more doses of vaccine.  Hepatitis A vaccine. Adults who wish to be protected from this disease, have certain high-risk conditions, work with hepatitis A-infected animals, work in hepatitis A research labs, or travel to or work in countries with a high rate of hepatitis A should be immunized. Adults who were previously unvaccinated and who anticipate close contact with an international adoptee during the first 60 days after arrival in the [place] from a country with a high rate of hepatitis A should be immunized.  Hepatitis B vaccine. Adults should be immunized if they wish to be protected from this disease, have certain high-risk conditions, may be exposed to blood or other infectious body fluids, are household contacts or sex partners of hepatitis B positive people, are clients or workers in certain care facilities, or travel to or work in countries with a high rate of hepatitis B.  Haemophilus influenzae type b (Hib) vaccine. A previously unvaccinated person with asplenia or sickle cell disease or having a scheduled splenectomy should receive 1 dose of Hib vaccine. Regardless of previous immunization, a recipient of a hematopoietic stem cell transplant  should receive a 3-dose series 6-12 months after his successful transplant. Hib vaccine is not recommended for adults with HIV infection. Preventive Service / Frequency Ages 64 to 22  Blood pressure check.** / Every 1 to 2 years.  Lipid and cholesterol check.** / Every 5 years beginning at age 26.  Hepatitis C blood test.** / For any individual with known risks for hepatitis C.  Skin self-exam. / Monthly.  Influenza vaccine. / Every year.  Tetanus, diphtheria, and acellular pertussis (Tdap, Td) vaccine.** / Consult your health care provider. 1 dose of Td every 10 years.  Varicella vaccine.** / Consult your health care provider.  HPV vaccine. / 3 doses over 6 months, if 18 or younger.  Measles, mumps, rubella (MMR) vaccine.** / You need at least 1 dose of MMR if you were born in 1957 or later. You may also need a second dose.  Pneumococcal 13-valent conjugate (PCV13) vaccine.** / Consult your health care provider.  Pneumococcal polysaccharide (PPSV23) vaccine.** / 1 to 2 doses if you smoke cigarettes or if you have certain conditions.  Meningococcal vaccine.** / 1 dose if you are age 51 to 43 years and a Market researcher living in a residence hall, or have one of several medical conditions. You may also need additional booster doses.  Hepatitis A vaccine.** / Consult your health care provider.  Hepatitis B vaccine.** / Consult your health care provider.  Haemophilus influenzae type b (Hib) vaccine.** / Consult your health care provider. Ages 68 to 62  Blood pressure check.** / Every 1 to 2 years.  Lipid and cholesterol check.** / Every 5 years beginning at age 86.  Lung cancer screening. / Every year if you are aged 12-80 years and have a 30-pack-year history of smoking and currently smoke or have quit within the past 15 years. Yearly screening is stopped once you have quit smoking for at least 15 years or develop a health problem that would prevent you from having  lung cancer treatment.  Fecal occult blood test (FOBT) of stool. / Every year beginning at age 20 and continuing until age 70. You may not have to do this test if  you get a colonoscopy every 10 years.  Flexible sigmoidoscopy** or colonoscopy.** / Every 5 years for a flexible sigmoidoscopy or every 10 years for a colonoscopy beginning at age 21 and continuing until age 43.  Hepatitis C blood test.** / For all people born from [birth year] through 1965 and any individual with known risks for hepatitis C.  Skin self-exam. / Monthly.  Influenza vaccine. / Every year.  Tetanus, diphtheria, and acellular pertussis (Tdap/Td) vaccine.** / Consult your health care provider. 1 dose of Td every 10 years.  Varicella vaccine.** / Consult your health care provider.  Zoster vaccine.** / 1 dose for adults aged 69 years or older.  Measles, mumps, rubella (MMR) vaccine.** / You need at least 1 dose of MMR if you were born in 1957 or later. You may also need a second dose.  Pneumococcal 13-valent conjugate (PCV13) vaccine.** / Consult your health care provider.  Pneumococcal polysaccharide (PPSV23) vaccine.** / 1 to 2 doses if you smoke cigarettes or if you have certain conditions.  Meningococcal vaccine.** / Consult your health care provider.  Hepatitis A vaccine.** / Consult your health care provider.  Hepatitis B vaccine.** / Consult your health care provider.  Haemophilus influenzae type b (Hib) vaccine.** / Consult your health care provider. Ages 50 and over  Blood pressure check.** / Every 1 to 2 years.  Lipid and cholesterol check.**/ Every 5 years beginning at age 74.  Lung cancer screening. / Every year if you are aged 18-80 years and have a 30-pack-year history of smoking and currently smoke or have quit within the past 15 years. Yearly screening is stopped once you have quit smoking for at least 15 years or develop a health problem that would prevent you from having lung cancer  treatment.  Fecal occult blood test (FOBT) of stool. / Every year beginning at age 72 and continuing until age 36. You may not have to do this test if you get a colonoscopy every 10 years.  Flexible sigmoidoscopy** or colonoscopy.** / Every 5 years for a flexible sigmoidoscopy or every 10 years for a colonoscopy beginning at age 42 and continuing until age 57.  Hepatitis C blood test.** / For all people born from [birth year] through 1965 and any individual with known risks for hepatitis C.  Abdominal aortic aneurysm (AAA) screening.** / A one-time screening for ages 76 to 54 years who are current or former smokers.  Skin self-exam. / Monthly.  Influenza vaccine. / Every year.  Tetanus, diphtheria, and acellular pertussis (Tdap/Td) vaccine.** / 1 dose of Td every 10 years.  Varicella vaccine.** / Consult your health care provider.  Zoster vaccine.** / 1 dose for adults aged 84 years or older.  Pneumococcal 13-valent conjugate (PCV13) vaccine.** / Consult your health care provider.  Pneumococcal polysaccharide (PPSV23) vaccine.** / 1 dose for all adults aged 76 years and older.  Meningococcal vaccine.** / Consult your health care provider.  Hepatitis A vaccine.** / Consult your health care provider.  Hepatitis B vaccine.** / Consult your health care provider.  Haemophilus influenzae type b (Hib) vaccine.** / Consult your health care provider. **Family history and personal history of risk and conditions may change your health care provider's recommendations. Document Released: 12/09/2001 Document Revised: 10/18/2013 Document Reviewed: 03/10/2011 [facility] Patient Information 2015 [place], [place]. This information is not intended to replace advice given to you by your health care provider. Make sure you discuss any questions you have with your health care provider.

## 2014-05-25 NOTE — Progress Notes (Deleted)
Patient presents to clinic today for annual exam.  Patient is fasting for labs.  Acute Concerns:   Chronic Issues:   Health Maintenance: Dental -- Vision -- Immunizations -- Colonoscopy --  Past Medical History  Diagnosis Date  . Allergic rhinitis   . Asthmatic bronchitis   . Hyperlipidemia   . GERD (gastroesophageal reflux disease)   . IBS (irritable bowel syndrome)   . Hemorrhoids   . History of renal calculi   . Headache(784.0)   . History of herpes zoster   . Postherpetic neuralgia   . Vitamin D deficiency   . Seborrheic dermatitis     Past Surgical History  Procedure Laterality Date  . Incision and drainage / excision thyroglossal cyst  1954  . Bilate tympanomastoid surgery    . Cystoscopic left renal stone extraction  1997    Dr Leander Rams  . Right ureteroscopic stone extraction  2004    Dr Karsten Ro  . Colonoscopy      Current Outpatient Prescriptions on File Prior to Visit  Medication Sig Dispense Refill  . aspirin 81 MG tablet Take 81 mg by mouth daily.      . ciprofloxacin (CIPRO) 500 MG tablet Take 1 tablet (500 mg total) by mouth 2 (two) times daily.  14 tablet  0  . clotrimazole (LOTRIMIN) 1 % cream Apply 1 application topically 2 (two) times daily.  30 g  0  . diazepam (VALIUM) 2 MG tablet TAKE 1 TABLET EVERY DAY AS NEEDED  90 tablet  1  . fluticasone (FLONASE) 50 MCG/ACT nasal spray USE 2 SPRAYS NASALLY DAILY  48 g  0  . ranitidine (ZANTAC) 150 MG tablet Take 150 mg by mouth at bedtime as needed.      . simvastatin (ZOCOR) 40 MG tablet TAKE 1 TABLET AT BEDTIME (PATIENT WILL NEED OFFICE VISIT WITH DR. NADEL FOR FURTHER REFILLS)  90 tablet  0  . Vitamin D, Ergocalciferol, (DRISDOL) 50000 UNITS CAPS capsule TAKE 1 CAPSULE EVERY 7 DAYS  12 capsule  0   No current facility-administered medications on file prior to visit.    No Known Allergies  Family History  Problem Relation Age of Onset  . Hypertension Mother   . Diabetes Mother   . Colon cancer Neg  Hx   . Stomach cancer Neg Hx     History   Social History  . Marital Status: Single    Spouse Name: N/A    Number of Children: 0  . Years of Education: N/A   Occupational History  . 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 Smoking status: Never Smoker   . Smokeless tobacco: Never Used  . Alcohol Use: Yes     Comment: occasional  . Drug Use: No  . Sexual Activity: Not on file   Other Topics Concern  . Not on file   Social History Narrative  . No narrative on file    ROS  Ht 5\' 10"  (1.778 m)  Physical Exam  Recent Results (from the past 2160 hour(s))  CULTURE, URINE COMPREHENSIVE     Status: None   Collection Time    04/27/14  9:00 AM      Result Value Ref Range   Colony Count NO GROWTH     Organism ID, Bacteria NO GROWTH    GC/CHLAMYDIA PROBE AMP, URINE     Status: None   Collection Time    04/27/14  9:00 AM  Result Value Ref Range   Chlamydia, Swab/Urine, PCR NEGATIVE  NEGATIVE   Comment:                          **Normal Reference Range: Negative**                  Assay performed using the Gen-Probe APTIMA COMBO2 (R) Assay.         GC Probe Amp, Urine NEGATIVE  NEGATIVE   Comment:                          **Normal Reference Range: Negative**                  Assay performed using the Gen-Probe APTIMA COMBO2 (R) Assay.        POCT URINALYSIS DIPSTICK     Status: None   Collection Time    04/27/14  9:00 AM      Result Value Ref Range   Color, UA straw     Clarity, UA cloudy     Glucose, UA neg     Bilirubin, UA neg     Ketones, UA trace     Spec Grav, UA 1.015     Blood, UA neg     pH, UA 6.0     Protein, UA neg     Urobilinogen, UA 0.2     Nitrite, UA neg     Leukocytes, UA Negative    HSV 1 ANTIBODY, IGG     Status: None   Collection Time    05/01/14  4:25 PM      Result Value Ref Range   HSV 1 Glycoprotein G Ab, IgG <0.10     Comment:          IV = Index Value                  < 0.90 IV               Negative                  0.90-1.10 IV           Equivocal                  > 1.10 IV              Positive  HSV 2 ANTIBODY, IGG     Status: None   Collection Time    05/01/14  4:25 PM      Result Value Ref Range   HSV 2 Glycoprotein G Ab, IgG <0.10     Comment:          IV = Index Value                  < 0.90 IV              Negative                  0.90-1.10 IV           Equivocal                  > 1.10 IV              Positive  HSV(HERPES SIMPLEX VRS) I + II AB-IGM     Status: None   Collection Time    05/01/14  4:25 PM      Result Value Ref Range   Herpes Simplex Vrs I&II-IgM Ab (EIA) 0.35     Comment:                     <=0.90 . . . . . . Marland Kitchen NEGATIVE                   0.91-1.09. . . . . Marland Kitchen EQUIVOCAL                   >=1.10 . . . . . . Marland Kitchen POSITIVE                                                                                                           The results obtained with the HSV 1      an aid to diagnosis and should not be interpreted as diagnostic by     themselves. Heterotypic IgM antibody responses may occur in patients     with a history of infection with other Herpes viruses, including     Epstein-Barr virus and Varicella zoster virus, and give false positive     results in HSV 1      between HSV 1 and HSV 2.  A positive HSV IgM may indicate primary     infection, but IgM antibody can persist 12 or more months after     primary infection.  For confirmation, if clinically indicated,     positive IgM results could be followed with the test for HSV 1      glycoprotein (test code [phone]) in 4-6 weeks.  RPR     Status: None   Collection Time    05/01/14  4:25 PM      Result Value Ref Range   RPR NON REAC  NON REAC    Assessment/Plan: No problem-specific assessment & plan notes found for this encounter.

## 2014-05-26 DIAGNOSIS — Z23 Encounter for immunization: Secondary | ICD-10-CM | POA: Insufficient documentation

## 2014-05-26 NOTE — Assessment & Plan Note (Signed)
Continue FLonase.

## 2014-05-26 NOTE — Assessment & Plan Note (Signed)
Doing well on Zocor. No myalgias.  Recent labs reveal good control. Continue current regimen.

## 2014-05-26 NOTE — Assessment & Plan Note (Signed)
Occasional symptoms with poor dietary choices.  Continue PRN Zantac.  Encouraged weight loss.  Avoid late-night eating and trigger foods.  Elevate HOB.

## 2014-05-26 NOTE — Assessment & Plan Note (Signed)
Zostvax given by nursing staff.

## 2014-05-26 NOTE — Assessment & Plan Note (Signed)
Rare flare-ups for which he has Rx for Valium to take at symptom onset.  No concerns at present.  Continue current regimen.  Watch salt intake.

## 2014-05-26 NOTE — Assessment & Plan Note (Signed)
Immunization given by nursing staff. 

## 2014-06-04 ENCOUNTER — Other Ambulatory Visit: Payer: Self-pay | Admitting: Pulmonary Disease

## 2014-06-12 ENCOUNTER — Other Ambulatory Visit: Payer: Self-pay | Admitting: Physician Assistant

## 2014-06-12 ENCOUNTER — Telehealth: Payer: Self-pay | Admitting: *Deleted

## 2014-06-12 MED ORDER — VITAMIN D (ERGOCALCIFEROL) 1.25 MG (50000 UNIT) PO CAPS: ORAL_CAPSULE | ORAL | Status: DC

## 2014-06-12 NOTE — Telephone Encounter (Signed)
LMOM with contact name and number for [return call, if need] RE: Vitamin D dosing instructions per provider instructions/SLS

## 2014-06-12 NOTE — Telephone Encounter (Signed)
Message Call patient Received: Today    Staff Message From: Brunetta Jeans, PA-C    To: Russell Bullock, CMA    I received refill request for patient's Vitamin D. His instructions state to take once weekly. This is typically done for 2 months, then reduced to one 50,000 unit tablet every 2 weeks. I am reducing his dosing to that so we don't overload him on vitamin d. Recheck vitamin D level in 2 months

## 2014-08-28 ENCOUNTER — Telehealth: Payer: Self-pay | Admitting: Physician Assistant

## 2014-08-28 ENCOUNTER — Other Ambulatory Visit: Payer: Self-pay | Admitting: Physician Assistant

## 2014-08-28 MED ORDER — SILDENAFIL CITRATE 50 MG PO TABS: 25.0000 mg | ORAL_TABLET | Freq: Every day | ORAL | Status: DC | PRN

## 2014-08-28 NOTE — Telephone Encounter (Signed)
LMOM with contact name and number for return call RE: "medication" requested sent to pharmacy and further provider instructions/SLS

## 2014-08-28 NOTE — Telephone Encounter (Signed)
Viagra sent in.  Take 1/2 tablet 1 hour before desired effect. No more than 1 pill in 24 hour period.

## 2014-08-28 NOTE — Telephone Encounter (Signed)
Please Advise

## 2014-08-28 NOTE — Telephone Encounter (Signed)
Caller name: Brodric Relation to pt: self Call back number: [phone] [facility]acy:  [facility]  Reason for call:   Patient states that he has been under a lot of stress and is having problems "performing" and would like to know if Einar Pheasant could send in one pill for this.

## 2014-10-30 ENCOUNTER — Ambulatory Visit (INDEPENDENT_AMBULATORY_CARE_PROVIDER_SITE_OTHER): Payer: Managed Care, Other (non HMO) | Admitting: Physician Assistant

## 2014-10-30 ENCOUNTER — Encounter: Payer: Self-pay | Admitting: Physician Assistant

## 2014-10-30 VITALS — BP 134/76 | HR 89 | Temp 98.4°F | Resp 18 | Ht 70.0 in | Wt 252.0 lb

## 2014-10-30 DIAGNOSIS — B9689 Other specified bacterial agents as the cause of diseases classified elsewhere: Secondary | ICD-10-CM | POA: Insufficient documentation

## 2014-10-30 DIAGNOSIS — J208 Acute bronchitis due to other specified organisms: Principal | ICD-10-CM

## 2014-10-30 DIAGNOSIS — J00 Acute nasopharyngitis [common cold]: Secondary | ICD-10-CM

## 2014-10-30 MED ORDER — AZITHROMYCIN 250 MG PO TABS: ORAL_TABLET | ORAL | Status: DC

## 2014-10-30 MED ORDER — HYDROCOD POLST-CHLORPHEN POLST 10-8 MG/5ML PO LQCR: 5.0000 mL | Freq: Two times a day (BID) | ORAL | Status: DC | PRN

## 2014-10-30 NOTE — Assessment & Plan Note (Signed)
Rx Azithromycin.  Tussionex for cough.  Increase fluids.  Rest. Continue Mucinex. Humidifier in bedroom.  Call or return to clinic if symptoms are not improving.

## 2014-10-30 NOTE — Patient Instructions (Signed)
Please take antibiotic as directed.  Increase fluid intake. Get plenty of rest.  Continue the Mucinex following the package instructions.  Use Tussionex as directed for nighttime cough.  Call or return to clinic if symptoms are not improving.  Acute Bronchitis Bronchitis is when the airways that extend from the windpipe into the lungs get red, puffy, and painful (inflamed). Bronchitis often causes thick spit (mucus) to develop. This leads to a cough. A cough is the most common symptom of bronchitis. In acute bronchitis, the condition usually begins suddenly and goes away over time (usually in 2 weeks). Smoking, allergies, and asthma can make bronchitis worse. Repeated episodes of bronchitis may cause more lung problems. HOME CARE  Rest.  Drink enough fluids to keep your pee (urine) clear or pale yellow (unless you need to limit fluids as told by your doctor).  Only take over-the-counter or prescription medicines as told by your doctor.  Avoid smoking and secondhand smoke. These can make bronchitis worse. If you are a smoker, think about using nicotine gum or skin patches. Quitting smoking will help your lungs heal faster.  Reduce the chance of getting bronchitis again by:  Washing your hands often.  Avoiding people with cold symptoms.  Trying not to touch your hands to your mouth, nose, or eyes.  Follow up with your doctor as told. GET HELP IF: Your symptoms do not improve after 1 week of treatment. Symptoms include:  Cough.  Fever.  Coughing up thick spit.  Body aches.  Chest congestion.  Chills.  Shortness of breath.  Sore throat. GET HELP RIGHT AWAY IF:   You have an increased fever.  You have chills.  You have severe shortness of breath.  You have bloody thick spit (sputum).  You throw up (vomit) often.  You lose too much body fluid (dehydration).  You have a severe headache.  You faint. MAKE SURE YOU:   Understand these instructions.  Will watch  your condition.  Will get help right away if you are not doing well or get worse. Document Released: 03/31/2008 Document Revised: 06/15/2013 Document Reviewed: 04/05/2013 [facility] Patient Information 2015 [place], [place]. This information is not intended to replace advice given to you by your health care provider. Make sure you discuss any questions you have with your health care provider.

## 2014-10-30 NOTE — Progress Notes (Signed)
Pre visit review using our clinic review tool, if applicable. No additional management support is needed unless otherwise documented below in the visit note/SLS  

## 2014-10-30 NOTE — Progress Notes (Signed)
Patient presents to clinic today c/o > 1 week of chest congestion, cough productive of green sputum, sinus pressure, PND and sore throat, worsening over the past 4 days. Patient denies chest pain or SOB.  Endorses now with intermittent low-grade fever.  Took some tylenol a few hours ago.  Afebrile at present.  Past Medical History  Diagnosis Date  . Allergic rhinitis   . Asthmatic bronchitis   . Hyperlipidemia   . GERD (gastroesophageal reflux disease)   . IBS (irritable bowel syndrome)   . Hemorrhoids   . History of renal calculi   . Headache(784.0)   . History of herpes zoster   . Postherpetic neuralgia   . Vitamin D deficiency   . Seborrheic dermatitis     Current Outpatient Prescriptions on File Prior to Visit  Medication Sig Dispense Refill  . aspirin 81 MG tablet Take 81 mg by mouth daily.    . diazepam (VALIUM) 2 MG tablet TAKE 1 TABLET EVERY DAY AS NEEDED 90 tablet 1  . ranitidine (ZANTAC) 150 MG tablet Take 150 mg by mouth at bedtime as needed.    . sildenafil (VIAGRA) 50 MG tablet Take 0.5-1 tablets (25-50 mg total) by mouth daily as needed for erectile dysfunction. 1 tablet 0  . simvastatin (ZOCOR) 40 MG tablet TAKE 1 TABLET AT BEDTIME 90 tablet 0  . Vitamin D, Ergocalciferol, (DRISDOL) 50000 UNITS CAPS capsule TAKE 1 CAPSULE EVERY 14 DAYS 12 capsule 0   No current facility-administered medications on file prior to visit.    No Known Allergies  Family History  Problem Relation Age of Onset  . Hypertension Mother   . Diabetes Mother   . Colon cancer Neg Hx   . Stomach cancer Neg Hx   . Arthritis Mother   . Lung cancer Father 38    Deceased  . Hyperlipidemia Mother   . Mental illness Mother   . Arthritis Maternal Grandmother   . Alcoholism Paternal Grandfather     History   Social History  . Marital Status: Single    Spouse Name: N/A    Number of Children: 0  . Years of Education: N/A   Occupational History  . 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 Smoking status: Never Smoker   . Smokeless tobacco: Never Used  . Alcohol Use: Yes     Comment: occasional  . Drug Use: No  . Sexual Activity: Yes     Comment: with one male partner    Other Topics Concern  . None   Social History Narrative   Review of Systems - See HPI.  All other ROS are negative.  BP 134/76 mmHg  Pulse 89  Temp(Src) 98.4 F (36.9 C) (Oral)  Resp 18  Ht 5\' 10"  (1.778 m)  Wt 252 lb (114.306 kg)  BMI 36.16 kg/m2  SpO2 98%  Physical Exam  Constitutional: He is oriented to person, place, and time and well-developed, well-nourished, and in no distress.  HENT:  Head: Normocephalic and atraumatic.  Right Ear: External ear normal.  Left Ear: External ear normal.  Nose: Nose normal.  Mouth/Throat: Oropharynx is clear and moist. No oropharyngeal exudate.  TM within normal limits bilaterally.  Eyes: Conjunctivae are normal.  Neck: Neck supple.  Cardiovascular: Normal rate, regular rhythm, normal heart sounds and intact distal pulses.   Pulmonary/Chest: Effort normal and breath sounds normal. No respiratory distress. He has no wheezes. He has no rales. He exhibits  no tenderness.  Lymphadenopathy:    He has no cervical adenopathy.  Neurological: He is alert and oriented to person, place, and time.  Skin: Skin is warm and dry. No rash noted.  Psychiatric: Affect normal.  Vitals reviewed.  Assessment/Plan: Acute bacterial bronchitis Rx Azithromycin.  Tussionex for cough.  Increase fluids.  Rest. Continue Mucinex. Humidifier in bedroom.  Call or return to clinic if symptoms are not improving.

## 2014-11-05 ENCOUNTER — Other Ambulatory Visit: Payer: Self-pay | Admitting: Physician Assistant

## 2014-11-06 NOTE — Telephone Encounter (Signed)
Rx request to pharmacy Denied/SLS Patient is to take Vit D3 OTC, come in to have VitD lab levels rechecked and we will proceed forward from the results per VO WCM/SLS

## 2015-01-04 ENCOUNTER — Other Ambulatory Visit: Payer: Self-pay | Admitting: Physician Assistant

## 2015-01-04 MED ORDER — SIMVASTATIN 40 MG PO TABS: ORAL_TABLET | ORAL | Status: DC

## 2015-01-04 NOTE — Telephone Encounter (Signed)
Caller name: Javohn Relation to pt: self Call back number: [phone] [facility] [place] [place]  Reason for call:   Requesting a simvastatin refill. Has cpe later this month.

## 2015-01-04 NOTE — Telephone Encounter (Signed)
Patient has CPE scheduled for 01/17/15.  Rx sent.

## 2015-01-05 ENCOUNTER — Telehealth: Payer: Self-pay | Admitting: Physician Assistant

## 2015-01-05 MED ORDER — SIMVASTATIN 40 MG PO TABS: ORAL_TABLET | ORAL | Status: DC

## 2015-01-05 NOTE — Telephone Encounter (Signed)
Rx request to pharmacy/SLS  

## 2015-01-05 NOTE — Telephone Encounter (Signed)
Caller name: Emin, Foree Relation to pt: self Call back number: [phone] Pharmacy: [id number] [place], [place] [phone] (Phone) [phone] (Fax)         Reason for call:  Pt requesting simvastatin (ZOCOR) 40 MG tablet please send to retail.

## 2015-01-16 ENCOUNTER — Telehealth: Payer: Self-pay | Admitting: *Deleted

## 2015-01-16 ENCOUNTER — Encounter: Payer: Self-pay | Admitting: *Deleted

## 2015-01-16 NOTE — Telephone Encounter (Signed)
Pre-Visit Call completed with patient and chart updated.   Pre-Visit Info documented in Specialty Comments under [employer].    

## 2015-01-17 ENCOUNTER — Encounter: Payer: Self-pay | Admitting: Physician Assistant

## 2015-01-17 ENCOUNTER — Ambulatory Visit (INDEPENDENT_AMBULATORY_CARE_PROVIDER_SITE_OTHER): Payer: BLUE CROSS/BLUE SHIELD | Admitting: Physician Assistant

## 2015-01-17 VITALS — BP 126/78 | HR 84 | Temp 98.0°F | Resp 16 | Ht 70.5 in | Wt 248.4 lb

## 2015-01-17 DIAGNOSIS — Z0000 Encounter for general adult medical examination without abnormal findings: Secondary | ICD-10-CM | POA: Insufficient documentation

## 2015-01-17 DIAGNOSIS — Z136 Encounter for screening for cardiovascular disorders: Secondary | ICD-10-CM | POA: Insufficient documentation

## 2015-01-17 DIAGNOSIS — G4700 Insomnia, unspecified: Secondary | ICD-10-CM

## 2015-01-17 DIAGNOSIS — N529 Male erectile dysfunction, unspecified: Secondary | ICD-10-CM | POA: Insufficient documentation

## 2015-01-17 LAB — CBC
HCT: 35.5 % — ABNORMAL LOW (ref 39.0–52.0)
HEMOGLOBIN: 11.6 g/dL — AB (ref 13.0–17.0)
MCHC: 32.6 g/dL (ref 30.0–36.0)
MCV: 79.3 fl (ref 78.0–100.0)
Platelets: 297 10*3/uL (ref 150.0–400.0)
RBC: 4.48 Mil/uL (ref 4.22–5.81)
RDW: 16 % — AB (ref 11.5–15.5)
WBC: 6.5 10*3/uL (ref 4.0–10.5)

## 2015-01-17 LAB — COMPREHENSIVE METABOLIC PANEL
ALT: 15 U/L (ref 0–53)
AST: 15 U/L (ref 0–37)
Albumin: 4.3 g/dL (ref 3.5–5.2)
Alkaline Phosphatase: 64 U/L (ref 39–117)
BUN: 16 mg/dL (ref 6–23)
CALCIUM: 9.1 mg/dL (ref 8.4–10.5)
CO2: 26 meq/L (ref 19–32)
CREATININE: 1.04 mg/dL (ref 0.40–1.50)
Chloride: 106 mEq/L (ref 96–112)
GFR: 76.59 mL/min (ref >60.00)
GLUCOSE: 107 mg/dL — AB (ref 70–99)
Potassium: 4.1 mEq/L (ref 3.5–5.1)
Sodium: 139 mEq/L (ref 135–145)
TOTAL PROTEIN: 7 g/dL (ref 6.0–8.3)
Total Bilirubin: 0.5 mg/dL (ref 0.2–1.2)

## 2015-01-17 LAB — LIPID PANEL
CHOL/HDL RATIO: 3
Cholesterol: 145 mg/dL (ref 0–200)
HDL: 44.2 mg/dL (ref >39.00)
LDL Cholesterol: 72 mg/dL (ref 0–99)
NONHDL: 100.8
Triglycerides: 143 mg/dL (ref 0.0–149.0)
VLDL: 28.6 mg/dL (ref 0.0–40.0)

## 2015-01-17 LAB — VITAMIN D 25 HYDROXY (VIT D DEFICIENCY, FRACTURES): VITD: 14.3 ng/mL — ABNORMAL LOW (ref 30.00–100.00)

## 2015-01-17 LAB — HEMOGLOBIN A1C: Hgb A1c MFr Bld: 6.8 % — ABNORMAL HIGH (ref 4.6–6.5)

## 2015-01-17 LAB — PSA: PSA: 1.13 ng/mL (ref 0.10–4.00)

## 2015-01-17 MED ORDER — ZOLPIDEM TARTRATE 5 MG PO TABS: 5.0000 mg | ORAL_TABLET | Freq: Every evening | ORAL | Status: DC | PRN

## 2015-01-17 MED ORDER — SILDENAFIL CITRATE 50 MG PO TABS: 25.0000 mg | ORAL_TABLET | Freq: Every day | ORAL | Status: DC | PRN

## 2015-01-17 MED ORDER — DIAZEPAM 2 MG PO TABS: ORAL_TABLET | ORAL | Status: DC

## 2015-01-17 NOTE — Patient Instructions (Signed)
Please stop by the lab for blood work. I will call you with the results. Continue your current medication regimen as directed. Stay active and eat a well-balanced diet.  Follow-up will be determined by your lab results.  Preventive Care for Adults A healthy lifestyle and preventive care can promote health and wellness. Preventive health guidelines for men include the following key practices:  A routine yearly physical is a good way to check with your health care provider about your health and preventative screening. It is a chance to share any concerns and updates on your health and to receive a thorough exam.  Visit your dentist for a routine exam and preventative care every 6 months. Brush your teeth twice a day and floss once a day. Good oral hygiene prevents tooth decay and gum disease.  The frequency of eye exams is based on your age, health, family medical history, use of contact lenses, and other factors. Follow your health care provider's recommendations for frequency of eye exams.  Eat a healthy diet. Foods such as vegetables, fruits, whole grains, low-fat dairy products, and lean protein foods contain the nutrients you need without too many calories. Decrease your intake of foods high in solid fats, added sugars, and salt. Eat the right amount of calories for you.Get information about a proper diet from your health care provider, if necessary.  Regular physical exercise is one of the most important things you can do for your health. Most adults should get at least 150 minutes of moderate-intensity exercise (any activity that increases your heart rate and causes you to sweat) each week. In addition, most adults need muscle-strengthening exercises on 2 or more days a week.  Maintain a healthy weight. The body mass index (BMI) is a screening tool to identify possible weight problems. It provides an estimate of body fat based on height and weight. Your health care provider can find your BMI  and can help you achieve or maintain a healthy weight.For adults 20 years and older:  A BMI below 18.5 is considered underweight.  A BMI of 18.5 to 24.9 is normal.  A BMI of 25 to 29.9 is considered overweight.  A BMI of 30 and above is considered obese.  Maintain normal blood lipids and cholesterol levels by exercising and minimizing your intake of saturated fat. Eat a balanced diet with plenty of fruit and vegetables. Blood tests for lipids and cholesterol should begin at age 23 and be repeated every 5 years. If your lipid or cholesterol levels are high, you are over 50, or you are at high risk for heart disease, you may need your cholesterol levels checked more frequently.Ongoing high lipid and cholesterol levels should be treated with medicines if diet and exercise are not working.  If you smoke, find out from your health care provider how to quit. If you do not use tobacco, do not start.  Lung cancer screening is recommended for adults aged 67-80 years who are at high risk for developing lung cancer because of a history of smoking. A yearly low-dose CT scan of the lungs is recommended for people who have at least a 30-pack-year history of smoking and are a current smoker or have quit within the past 15 years. A pack year of smoking is smoking an average of 1 pack of cigarettes a day for 1 year (for example: 1 pack a day for 30 years or 2 packs a day for 15 years). Yearly screening should continue until the smoker has stopped  smoking for at least 15 years. Yearly screening should be stopped for people who develop a health problem that would prevent them from having lung cancer treatment.  If you choose to drink alcohol, do not have more than 2 drinks per day. One drink is considered to be 12 ounces (355 mL) of beer, 5 ounces (148 mL) of wine, or 1.5 ounces (44 mL) of liquor.  Avoid use of street drugs. Do not share needles with anyone. Ask for help if you need support or instructions about  stopping the use of drugs.  High blood pressure causes heart disease and increases the risk of stroke. Your blood pressure should be checked at least every 1-2 years. Ongoing high blood pressure should be treated with medicines, if weight loss and exercise are not effective.  If you are 69-76 years old, ask your health care provider if you should take aspirin to prevent heart disease.  Diabetes screening involves taking a blood sample to check your fasting blood sugar level. This should be done once every 3 years, after age 11, if you are within normal weight and without risk factors for diabetes. Testing should be considered at a younger age or be carried out more frequently if you are overweight and have at least 1 risk factor for diabetes.  Colorectal cancer can be detected and often prevented. Most routine colorectal cancer screening begins at the age of 56 and continues through age 50. However, your health care provider may recommend screening at an earlier age if you have risk factors for colon cancer. On a yearly basis, your health care provider may provide home test kits to check for hidden blood in the stool. Use of a small camera at the end of a tube to directly examine the colon (sigmoidoscopy or colonoscopy) can detect the earliest forms of colorectal cancer. Talk to your health care provider about this at age 63, when routine screening begins. Direct exam of the colon should be repeated every 5-10 years through age 48, unless early forms of precancerous polyps or small growths are found.  People who are at an increased risk for hepatitis B should be screened for this virus. You are considered at high risk for hepatitis B if:  You were born in a country where hepatitis B occurs often. Talk with your health care provider about which countries are considered high risk.  Your parents were born in a high-risk country and you have not received a shot to protect against hepatitis B (hepatitis B  vaccine).  You have HIV or AIDS.  You use needles to inject street drugs.  You live with, or have sex with, someone who has hepatitis B.  You are a man who has sex with other men (MSM).  You get hemodialysis treatment.  You take certain medicines for conditions such as cancer, organ transplantation, and autoimmune conditions.  Hepatitis C blood testing is recommended for all people born from [birth year] through 1965 and any individual with known risks for hepatitis C.  Practice safe sex. Use condoms and avoid high-risk sexual practices to reduce the spread of sexually transmitted infections (STIs). STIs include gonorrhea, chlamydia, syphilis, trichomonas, herpes, HPV, and human immunodeficiency virus (HIV). Herpes, HIV, and HPV are viral illnesses that have no cure. They can result in disability, cancer, and death.  If you are at risk of being infected with HIV, it is recommended that you take a prescription medicine daily to prevent HIV infection. This is called preexposure prophylaxis (PrEP). You  are considered at risk if:  You are a man who has sex with other men (MSM) and have other risk factors.  You are a heterosexual man, are sexually active, and are at increased risk for HIV infection.  You take drugs by injection.  You are sexually active with a partner who has HIV.  Talk with your health care provider about whether you are at high risk of being infected with HIV. If you choose to begin PrEP, you should first be tested for HIV. You should then be tested every 3 months for as long as you are taking PrEP.  A one-time screening for abdominal aortic aneurysm (AAA) and surgical repair of large AAAs by ultrasound are recommended for men ages 69 to 50 years who are current or former smokers.  Healthy men should no longer receive prostate-specific antigen (PSA) blood tests as part of routine cancer screening. Talk with your health care provider about prostate cancer screening.  Testicular  cancer screening is not recommended for adult males who have no symptoms. Screening includes self-exam, a health care provider exam, and other screening tests. Consult with your health care provider about any symptoms you have or any concerns you have about testicular cancer.  Use sunscreen. Apply sunscreen liberally and repeatedly throughout the day. You should seek shade when your shadow is shorter than you. Protect yourself by wearing long sleeves, pants, a wide-brimmed hat, and sunglasses year round, whenever you are outdoors.  Once a month, do a whole-body skin exam, using a mirror to look at the skin on your back. Tell your health care provider about new moles, moles that have irregular borders, moles that are larger than a pencil eraser, or moles that have changed in shape or color.  Stay current with required vaccines (immunizations).  Influenza vaccine. All adults should be immunized every year.  Tetanus, diphtheria, and acellular pertussis (Td, Tdap) vaccine. An adult who has not previously received Tdap or who does not know his vaccine status should receive 1 dose of Tdap. This initial dose should be followed by tetanus and diphtheria toxoids (Td) booster doses every 10 years. Adults with an unknown or incomplete history of completing a 3-dose immunization series with Td-containing vaccines should begin or complete a primary immunization series including a Tdap dose. Adults should receive a Td booster every 10 years.  Varicella vaccine. An adult without evidence of immunity to varicella should receive 2 doses or a second dose if he has previously received 1 dose.  Human papillomavirus (HPV) vaccine. Males aged 85-21 years who have not received the vaccine previously should receive the 3-dose series. Males aged 22-26 years may be immunized. Immunization is recommended through the age of 15 years for any male who has sex with males and did not get any or all doses earlier. Immunization is  recommended for any person with an immunocompromised condition through the age of 64 years if he did not get any or all doses earlier. During the 3-dose series, the second dose should be obtained 4-8 weeks after the first dose. The third dose should be obtained 24 weeks after the first dose and 16 weeks after the second dose.  Zoster vaccine. One dose is recommended for adults aged 52 years or older unless certain conditions are present.  Measles, mumps, and rubella (MMR) vaccine. Adults born before [birth year] generally are considered immune to measles and mumps. Adults born in [birth year] or later should have 1 or more doses of MMR vaccine unless there is  a contraindication to the vaccine or there is laboratory evidence of immunity to each of the three diseases. A routine second dose of MMR vaccine should be obtained at least 28 days after the first dose for students attending postsecondary schools, health care workers, or international travelers. People who received inactivated measles vaccine or an unknown type of measles vaccine during 1963-1967 should receive 2 doses of MMR vaccine. People who received inactivated mumps vaccine or an unknown type of mumps vaccine before 1979 and are at high risk for mumps infection should consider immunization with 2 doses of MMR vaccine. Unvaccinated health care workers born before [birth year] who lack laboratory evidence of measles, mumps, or rubella immunity or laboratory confirmation of disease should consider measles and mumps immunization with 2 doses of MMR vaccine or rubella immunization with 1 dose of MMR vaccine.  Pneumococcal 13-valent conjugate (PCV13) vaccine. When indicated, a person who is uncertain of his immunization history and has no record of immunization should receive the PCV13 vaccine. An adult aged 53 years or older who has certain medical conditions and has not been previously immunized should receive 1 dose of PCV13 vaccine. This PCV13 should be followed with a dose  of pneumococcal polysaccharide (PPSV23) vaccine. The PPSV23 vaccine dose should be obtained at least 8 weeks after the dose of PCV13 vaccine. An adult aged 48 years or older who has certain medical conditions and previously received 1 or more doses of PPSV23 vaccine should receive 1 dose of PCV13. The PCV13 vaccine dose should be obtained 1 or more years after the last PPSV23 vaccine dose.  Pneumococcal polysaccharide (PPSV23) vaccine. When PCV13 is also indicated, PCV13 should be obtained first. All adults aged 75 years and older should be immunized. An adult younger than age 57 years who has certain medical conditions should be immunized. Any person who resides in a nursing home or long-term care facility should be immunized. An adult smoker should be immunized. People with an immunocompromised condition and certain other conditions should receive both PCV13 and PPSV23 vaccines. People with human immunodeficiency virus (HIV) infection should be immunized as soon as possible after diagnosis. Immunization during chemotherapy or radiation therapy should be avoided. Routine use of PPSV23 vaccine is not recommended for American Indians, [place]atives, or people younger than 65 years unless there are medical conditions that require PPSV23 vaccine. When indicated, people who have unknown immunization and have no record of immunization should receive PPSV23 vaccine. One-time revaccination 5 years after the first dose of PPSV23 is recommended for people aged 19-64 years who have chronic kidney failure, nephrotic syndrome, asplenia, or immunocompromised conditions. People who received 1-2 doses of PPSV23 before age 32 years should receive another dose of PPSV23 vaccine at age 67 years or later if at least 5 years have passed since the previous dose. Doses of PPSV23 are not needed for people immunized with PPSV23 at or after age 56 years.  Meningococcal vaccine. Adults with asplenia or persistent complement component  deficiencies should receive 2 doses of quadrivalent meningococcal conjugate (MenACWY-D) vaccine. The doses should be obtained at least 2 months apart. Microbiologists working with certain meningococcal bacteria, [place] recruits, people at risk during an outbreak, and people who travel to or live in countries with a high rate of meningitis should be immunized. A first-year college student up through age 20 years who is living in a residence hall should receive a dose if he did not receive a dose on or after his 16th birthday. Adults who have  certain high-risk conditions should receive one or more doses of vaccine.  Hepatitis A vaccine. Adults who wish to be protected from this disease, have certain high-risk conditions, work with hepatitis A-infected animals, work in hepatitis A research labs, or travel to or work in countries with a high rate of hepatitis A should be immunized. Adults who were previously unvaccinated and who anticipate close contact with an international adoptee during the first 60 days after arrival in the [place] from a country with a high rate of hepatitis A should be immunized.  Hepatitis B vaccine. Adults should be immunized if they wish to be protected from this disease, have certain high-risk conditions, may be exposed to blood or other infectious body fluids, are household contacts or sex partners of hepatitis B positive people, are clients or workers in certain care facilities, or travel to or work in countries with a high rate of hepatitis B.  Haemophilus influenzae type b (Hib) vaccine. A previously unvaccinated person with asplenia or sickle cell disease or having a scheduled splenectomy should receive 1 dose of Hib vaccine. Regardless of previous immunization, a recipient of a hematopoietic stem cell transplant should receive a 3-dose series 6-12 months after his successful transplant. Hib vaccine is not recommended for adults with HIV infection. Preventive Service /  Frequency Ages 1 to 66  Blood pressure check.** / Every 1 to 2 years.  Lipid and cholesterol check.** / Every 5 years beginning at age 65.  Hepatitis C blood test.** / For any individual with known risks for hepatitis C.  Skin self-exam. / Monthly.  Influenza vaccine. / Every year.  Tetanus, diphtheria, and acellular pertussis (Tdap, Td) vaccine.** / Consult your health care provider. 1 dose of Td every 10 years.  Varicella vaccine.** / Consult your health care provider.  HPV vaccine. / 3 doses over 6 months, if 71 or younger.  Measles, mumps, rubella (MMR) vaccine.** / You need at least 1 dose of MMR if you were born in 1957 or later. You may also need a second dose.  Pneumococcal 13-valent conjugate (PCV13) vaccine.** / Consult your health care provider.  Pneumococcal polysaccharide (PPSV23) vaccine.** / 1 to 2 doses if you smoke cigarettes or if you have certain conditions.  Meningococcal vaccine.** / 1 dose if you are age 46 to 38 years and a Market researcher living in a residence hall, or have one of several medical conditions. You may also need additional booster doses.  Hepatitis A vaccine.** / Consult your health care provider.  Hepatitis B vaccine.** / Consult your health care provider.  Haemophilus influenzae type b (Hib) vaccine.** / Consult your health care provider. Ages 40 to 30  Blood pressure check.** / Every 1 to 2 years.  Lipid and cholesterol check.** / Every 5 years beginning at age 61.  Lung cancer screening. / Every year if you are aged 22-80 years and have a 30-pack-year history of smoking and currently smoke or have quit within the past 15 years. Yearly screening is stopped once you have quit smoking for at least 15 years or develop a health problem that would prevent you from having lung cancer treatment.  Fecal occult blood test (FOBT) of stool. / Every year beginning at age 57 and continuing until age 33. You may not have to do this test  if you get a colonoscopy every 10 years.  Flexible sigmoidoscopy** or colonoscopy.** / Every 5 years for a flexible sigmoidoscopy or every 10 years for a colonoscopy beginning at  age 36 and continuing until age 54.  Hepatitis C blood test.** / For all people born from [birth year] through 1965 and any individual with known risks for hepatitis C.  Skin self-exam. / Monthly.  Influenza vaccine. / Every year.  Tetanus, diphtheria, and acellular pertussis (Tdap/Td) vaccine.** / Consult your health care provider. 1 dose of Td every 10 years.  Varicella vaccine.** / Consult your health care provider.  Zoster vaccine.** / 1 dose for adults aged 81 years or older.  Measles, mumps, rubella (MMR) vaccine.** / You need at least 1 dose of MMR if you were born in 1957 or later. You may also need a second dose.  Pneumococcal 13-valent conjugate (PCV13) vaccine.** / Consult your health care provider.  Pneumococcal polysaccharide (PPSV23) vaccine.** / 1 to 2 doses if you smoke cigarettes or if you have certain conditions.  Meningococcal vaccine.** / Consult your health care provider.  Hepatitis A vaccine.** / Consult your health care provider.  Hepatitis B vaccine.** / Consult your health care provider.  Haemophilus influenzae type b (Hib) vaccine.** / Consult your health care provider. Ages 57 and over  Blood pressure check.** / Every 1 to 2 years.  Lipid and cholesterol check.**/ Every 5 years beginning at age 5.  Lung cancer screening. / Every year if you are aged 28-80 years and have a 30-pack-year history of smoking and currently smoke or have quit within the past 15 years. Yearly screening is stopped once you have quit smoking for at least 15 years or develop a health problem that would prevent you from having lung cancer treatment.  Fecal occult blood test (FOBT) of stool. / Every year beginning at age 47 and continuing until age 37. You may not have to do this test if you get a colonoscopy  every 10 years.  Flexible sigmoidoscopy** or colonoscopy.** / Every 5 years for a flexible sigmoidoscopy or every 10 years for a colonoscopy beginning at age 76 and continuing until age 67.  Hepatitis C blood test.** / For all people born from [birth year] through 1965 and any individual with known risks for hepatitis C.  Abdominal aortic aneurysm (AAA) screening.** / A one-time screening for ages 36 to 38 years who are current or former smokers.  Skin self-exam. / Monthly.  Influenza vaccine. / Every year.  Tetanus, diphtheria, and acellular pertussis (Tdap/Td) vaccine.** / 1 dose of Td every 10 years.  Varicella vaccine.** / Consult your health care provider.  Zoster vaccine.** / 1 dose for adults aged 39 years or older.  Pneumococcal 13-valent conjugate (PCV13) vaccine.** / Consult your health care provider.  Pneumococcal polysaccharide (PPSV23) vaccine.** / 1 dose for all adults aged 70 years and older.  Meningococcal vaccine.** / Consult your health care provider.  Hepatitis A vaccine.** / Consult your health care provider.  Hepatitis B vaccine.** / Consult your health care provider.  Haemophilus influenzae type b (Hib) vaccine.** / Consult your health care provider. **Family history and personal history of risk and conditions may change your health care provider's recommendations. Document Released: 12/09/2001 Document Revised: 10/18/2013 Document Reviewed: 03/10/2011 [facility] Patient Information 2015 [place], [place]. This information is not intended to replace advice given to you by your health care provider. Make sure you discuss any questions you have with your health care provider.

## 2015-01-17 NOTE — Assessment & Plan Note (Signed)
EKG  reveals normal sinus rhythm.

## 2015-01-17 NOTE — Progress Notes (Signed)
Patient presents to clinic today for annual exam.  Patient is fasting for labs.  Acute Concerns: Patient having difficulty following asleep over the past month. Has some significant stressors in his life at the moment. Has more difficulty falling asleep. Is averaging 5 hours of sleep per night. Feels tired throughout the day.  Chronic Issues: Hyperlipidemia --previously well controlled with statin therapy. Patient endorses taking medications daily as directed. Is fasting for labs today.  Erectile dysfunction -- Unchanged. Patient needs refill of medication.   Health Maintenance: Dental --Up-to-date  Vision --Up-to-date  Immunizations --Up-to-date  Colonoscopy --Up-to-date   Past Medical History  Diagnosis Date  . Allergic rhinitis   . Asthmatic bronchitis   . Hyperlipidemia   . GERD (gastroesophageal reflux disease)   . IBS (irritable bowel syndrome)   . Hemorrhoids   . History of renal calculi   . Headache(784.0)   . History of herpes zoster   . Postherpetic neuralgia   . Vitamin D deficiency   . Seborrheic dermatitis     Past Surgical History  Procedure Laterality Date  . Incision and drainage / excision thyroglossal cyst  1954  . Bilate tympanomastoid surgery    . Cystoscopic left renal stone extraction  1997    Dr Leander Rams  . Right ureteroscopic stone extraction  2004    Dr Karsten Ro  . Colonoscopy      Current Outpatient Prescriptions on File Prior to Visit  Medication Sig Dispense Refill  . aspirin 81 MG tablet Take 81 mg by mouth daily.    . fluticasone (FLONASE) 50 MCG/ACT nasal spray Place 2 sprays into both nostrils daily.    . ranitidine (ZANTAC) 150 MG tablet Take 150 mg by mouth at bedtime as needed.    . simvastatin (ZOCOR) 40 MG tablet TAKE 1 TABLET AT BEDTIME 30 tablet 0  . Vitamin D, Ergocalciferol, (DRISDOL) 50000 UNITS CAPS capsule TAKE 1 CAPSULE EVERY 14 DAYS (Patient not taking: Reported on 01/16/2015) 12 capsule 0   No current  facility-administered medications on file prior to visit.    Allergies  Allergen Reactions  . Codeine Nausea Only    Family History  Problem Relation Age of Onset  . Hypertension Mother   . Diabetes Mother   . Arthritis Mother   . Hyperlipidemia Mother   . Mental illness Mother   . Dementia Mother   . Colon cancer Neg Hx   . Stomach cancer Neg Hx   . Lung cancer Father 58    Deceased  . Arthritis Maternal Grandmother   . Alcoholism Paternal Grandfather   . Heart disease Brother 20    heart attack    History   Social History  . Marital Status: Single    Spouse Name: N/A  . Number of Children: 0  . Years of Education: N/A   Occupational History  . 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 Smoking status: Never Smoker   . Smokeless tobacco: Never Used  . Alcohol Use: Yes     Comment: occasional  . Drug Use: No  . Sexual Activity: Yes     Comment: with one male partner    Other Topics Concern  . Not on file   Social History Narrative    Review of Systems  Constitutional: Negative for fever and weight loss.  HENT: Negative for ear discharge, ear pain, hearing loss and tinnitus.   Eyes: Negative for blurred vision, double vision, photophobia  and pain.  Respiratory: Negative for cough and shortness of breath.   Cardiovascular: Negative for chest pain and palpitations.  Gastrointestinal: Negative for heartburn, nausea, vomiting, abdominal pain, diarrhea, constipation, blood in stool and melena.  Genitourinary: Negative for dysuria, urgency, frequency, hematuria and flank pain.  Musculoskeletal: Negative for falls.  Neurological: Negative for dizziness, loss of consciousness and headaches.  Endo/Heme/Allergies: Negative for environmental allergies.  Psychiatric/Behavioral: Negative for depression, suicidal ideas, hallucinations and substance abuse. The patient is not nervous/anxious and does not have insomnia.     BP 126/78 mmHg   Pulse 84  Temp(Src) 98 F (36.7 C) (Oral)  Resp 16  Ht 5' 10.5" (1.791 m)  Wt 248 lb 6 oz (112.662 kg)  BMI 35.12 kg/m2  SpO2 95%  Physical Exam  Constitutional: He is oriented to person, place, and time and well-developed, well-nourished, and in no distress.  HENT:  Head: Normocephalic and atraumatic.  Right Ear: External ear normal.  Left Ear: External ear normal.  Nose: Nose normal.  Mouth/Throat: Oropharynx is clear and moist. No oropharyngeal exudate.  Eyes: Conjunctivae and EOM are normal. Pupils are equal, round, and reactive to light.  Neck: Neck supple. No thyromegaly present.  Cardiovascular: Normal rate, regular rhythm, normal heart sounds and intact distal pulses.   Pulmonary/Chest: Effort normal and breath sounds normal. No respiratory distress. He has no wheezes. He has no rales. He exhibits no tenderness.  Abdominal: Soft. Bowel sounds are normal. He exhibits no distension and no mass. There is no tenderness. There is no rebound and no guarding.  Genitourinary: Testes/scrotum normal.  Patient defers.  Lymphadenopathy:    He has no cervical adenopathy.  Neurological: He is alert and oriented to person, place, and time.  Skin: Skin is warm and dry. No rash noted.  Psychiatric: Affect normal.  Vitals reviewed.   No results found for this or any previous visit (from the past 2160 hour(s)).  Assessment/Plan: Visit for preventive health examination I have reviewed the patient's medical history in detail and updated the computerized patient record. Person screening performed today with score 0. Health maintenance up-to-date. Declines hepatitis C screening. Preventative care discussed with patient. Handout given. Will obtain fasting labs at today's visit to include PSA for prostate cancer screening. Risks versus benefits of prostate cancer screening discussed with patient.    Insomnia Situational. Due to recent stressors. We'll Rx Ambien 5 mg for short-term use.  Encouraged p.m. exercise. Follow-up when necessary.   Screening for ischemic heart disease EKG  reveals normal sinus rhythm.

## 2015-01-17 NOTE — Assessment & Plan Note (Signed)
Situational. Due to recent stressors. We'll Rx Ambien 5 mg for short-term use. Encouraged p.m. exercise. Follow-up when necessary.

## 2015-01-17 NOTE — Assessment & Plan Note (Signed)
I have reviewed the patient's medical history in detail and updated the computerized patient record. Person screening performed today with score 0. Health maintenance up-to-date. Declines hepatitis C screening. Preventative care discussed with patient. Handout given. Will obtain fasting labs at today's visit to include PSA for prostate cancer screening. Risks versus benefits of prostate cancer screening discussed with patient.

## 2015-01-17 NOTE — Progress Notes (Signed)
Pre visit review using our clinic review tool, if applicable. No additional management support is needed unless otherwise documented below in the visit note. 

## 2015-01-23 ENCOUNTER — Telehealth: Payer: Self-pay | Admitting: Physician Assistant

## 2015-01-23 MED ORDER — VITAMIN D (ERGOCALCIFEROL) 1.25 MG (50000 UNIT) PO CAPS: 50000.0000 [IU] | ORAL_CAPSULE | ORAL | Status: DC

## 2015-01-23 NOTE — Telephone Encounter (Signed)
Patient informed, understood & agreed, Rx sent to pharmacy [local per pt, coming home on Thursday]/SLS

## 2015-01-23 NOTE — Telephone Encounter (Signed)
Caller name: Samael, Blades Relation to pt: self  Call back number: Best # [phone]   Reason for call:  Pt inquiring about lab results pt is out of town please call mobile number

## 2015-02-02 ENCOUNTER — Other Ambulatory Visit: Payer: Self-pay | Admitting: Physician Assistant

## 2015-02-19 ENCOUNTER — Other Ambulatory Visit: Payer: Self-pay | Admitting: Physician Assistant

## 2015-02-26 ENCOUNTER — Other Ambulatory Visit: Payer: Self-pay | Admitting: Physician Assistant

## 2015-02-26 NOTE — Telephone Encounter (Signed)
Please assess if he wants full prescription or just another tablet.

## 2015-02-27 NOTE — Telephone Encounter (Signed)
Pt requesting # 2 tablets.

## 2015-03-11 ENCOUNTER — Other Ambulatory Visit: Payer: Self-pay | Admitting: Physician Assistant

## 2015-05-09 ENCOUNTER — Other Ambulatory Visit: Payer: Self-pay | Admitting: Physician Assistant

## 2015-05-16 ENCOUNTER — Telehealth: Payer: Self-pay | Admitting: Physician Assistant

## 2015-05-16 MED ORDER — VITAMIN D (ERGOCALCIFEROL) 1.25 MG (50000 UNIT) PO CAPS: 50000.0000 [IU] | ORAL_CAPSULE | ORAL | Status: DC

## 2015-05-16 NOTE — Telephone Encounter (Signed)
Relation to [id number]  Call back number: [phone] Pharmacy: [id number] [place], [place] [phone] (Phone) [phone] (Fax)         Reason for call:  Patient is in town for a week requesting Vitamin D, Ergocalciferol, (DRISDOL) 50000 UNITS CAPS capsule.

## 2015-06-04 ENCOUNTER — Other Ambulatory Visit: Payer: Self-pay | Admitting: Physician Assistant

## 2015-07-07 ENCOUNTER — Other Ambulatory Visit: Payer: Self-pay | Admitting: Family Medicine

## 2015-07-31 IMAGING — CR DG CHEST 2V
2 series · 2 of 2 positions shown · non-contrast
Comparison: DG CHEST 2 VIEW dated 02/25/2012

CLINICAL DATA: cough, congestion

EXAM:
CHEST  2 VIEW

[view not recorded (1 of 2)]
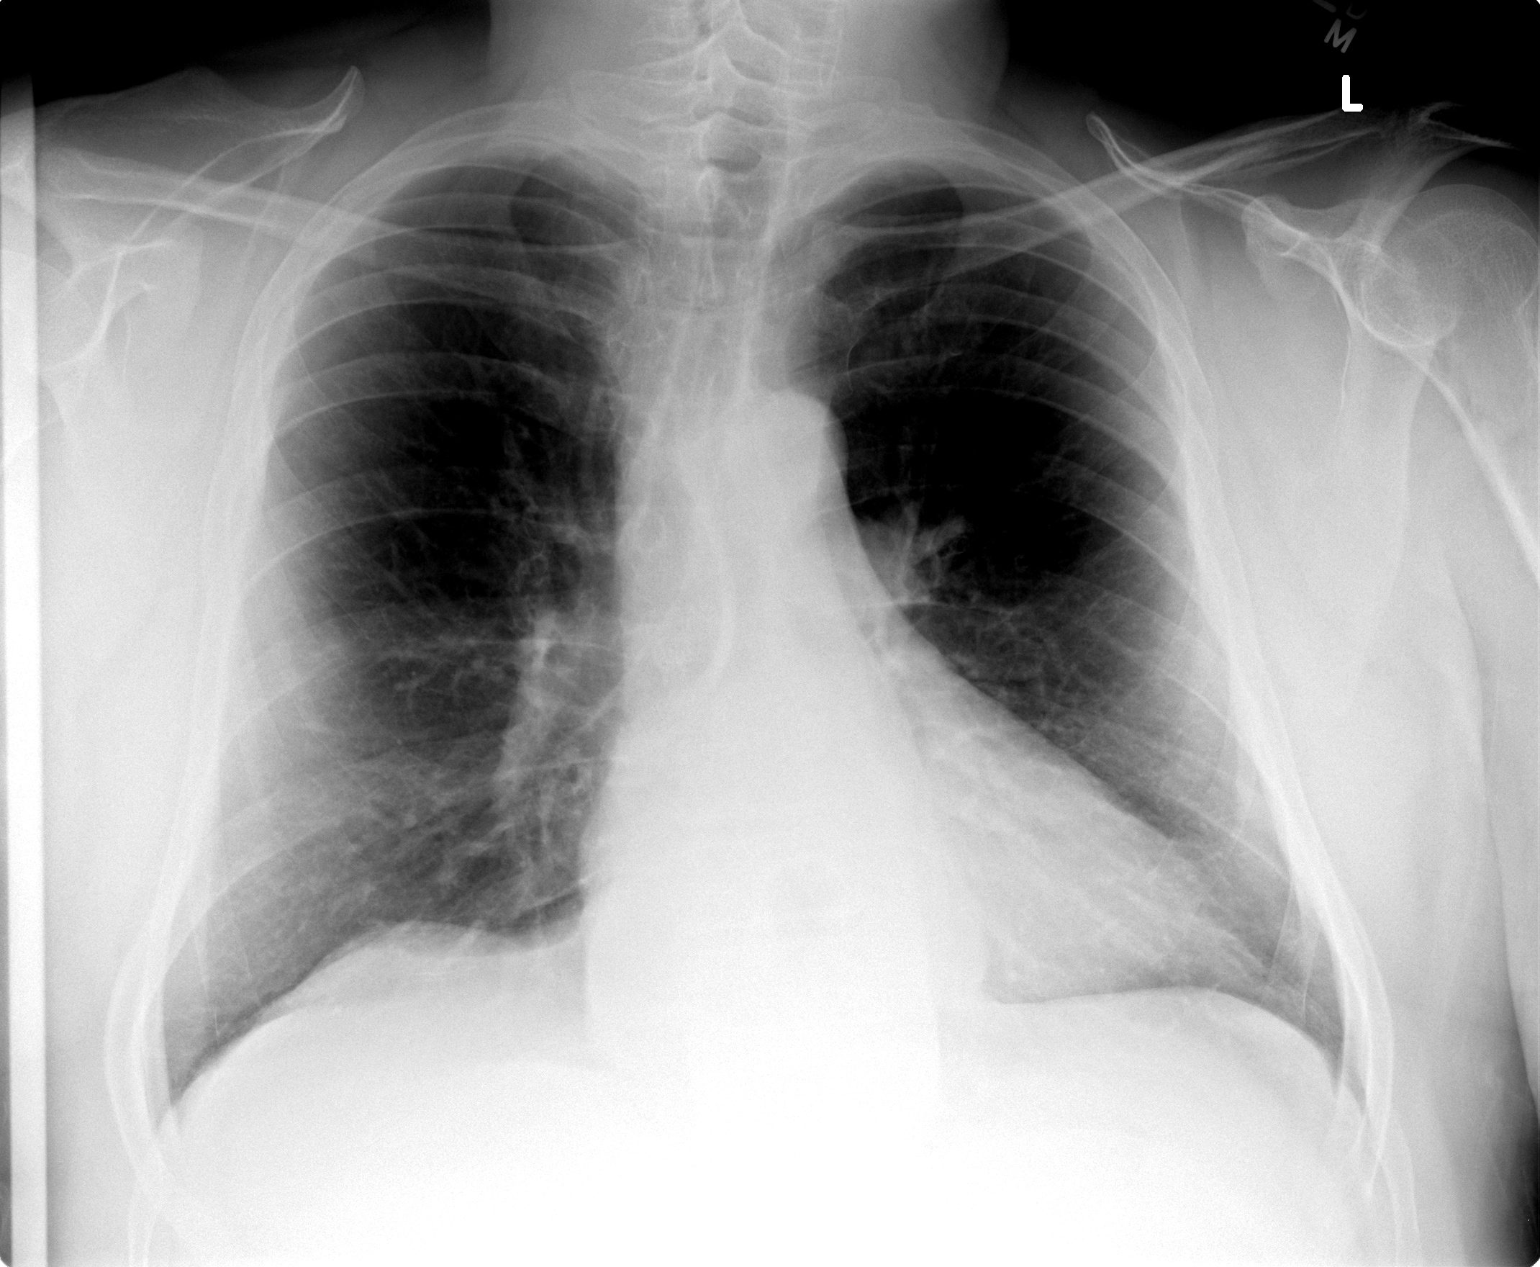

[view not recorded (2 of 2)]
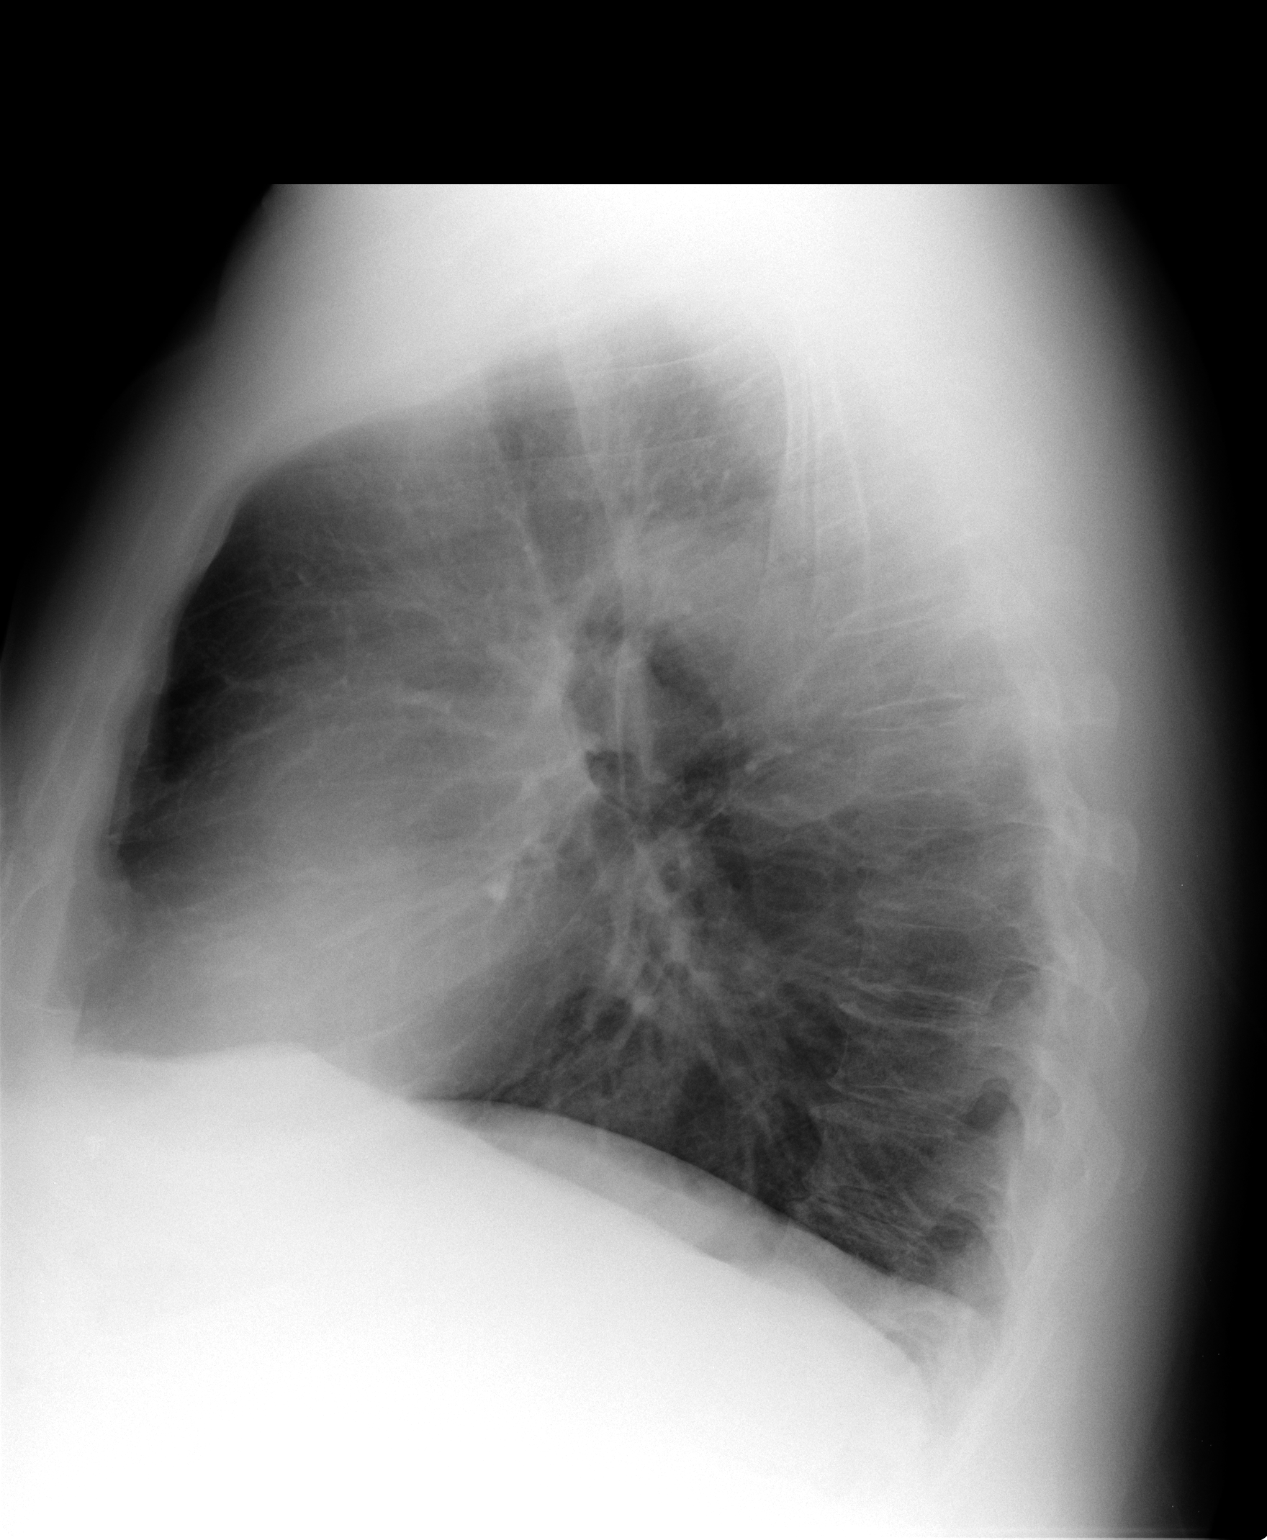

[2 of 2 positions shown; findings below may reference images not displayed]

FINDINGS: The heart size and mediastinal contours are within normal limits.
Both lungs are clear. The visualized skeletal structures are
unremarkable. There is flattening of the hemidiaphragms and
increased AP diameter of the chest.
IMPRESSION: Findings consistent COPD otherwise no active cardiopulmonary
disease.

## 2015-09-25 ENCOUNTER — Encounter: Payer: Self-pay | Admitting: Physician Assistant

## 2015-09-25 MED ORDER — HYDROCORTISONE ACETATE 25 MG RE SUPP: 25.0000 mg | Freq: Two times a day (BID) | RECTAL | Status: DC

## 2015-09-26 ENCOUNTER — Other Ambulatory Visit: Payer: Self-pay | Admitting: Physician Assistant

## 2015-09-27 ENCOUNTER — Ambulatory Visit (INDEPENDENT_AMBULATORY_CARE_PROVIDER_SITE_OTHER): Payer: BLUE CROSS/BLUE SHIELD | Admitting: Behavioral Health

## 2015-09-27 DIAGNOSIS — Z23 Encounter for immunization: Secondary | ICD-10-CM | POA: Diagnosis not present

## 2015-09-27 NOTE — Progress Notes (Signed)
Pre visit review using our clinic review tool, if applicable. No additional management support is needed unless otherwise documented below in the visit note. 

## 2015-10-02 ENCOUNTER — Telehealth: Payer: Self-pay | Admitting: Physician Assistant

## 2015-10-02 ENCOUNTER — Encounter: Payer: Self-pay | Admitting: Physician Assistant

## 2015-10-02 ENCOUNTER — Ambulatory Visit (INDEPENDENT_AMBULATORY_CARE_PROVIDER_SITE_OTHER): Payer: BLUE CROSS/BLUE SHIELD | Admitting: Physician Assistant

## 2015-10-02 VITALS — BP 130/60 | HR 99 | Temp 98.2°F | Ht 70.5 in | Wt 247.6 lb

## 2015-10-02 DIAGNOSIS — K602 Anal fissure, unspecified: Secondary | ICD-10-CM

## 2015-10-02 MED ORDER — HYDROCORTISONE ACE-PRAMOXINE 1-1 % RE FOAM: 1.0000 | Freq: Two times a day (BID) | RECTAL | Status: DC

## 2015-10-02 NOTE — Telephone Encounter (Signed)
Still takes 72 hours for the quickest PA. I will try to get the ball rolling, but have patient call insurance to get covered alternative list so we can get something sent in today.

## 2015-10-02 NOTE — Progress Notes (Signed)
Patient presents to clinic today c/o 2-3 weeks of intermittent bright red blood noted on toilet paper with mild rectal pain described as burning. Denies constipation, hard stool, trauma or injury. Endorses recent "stomach bug" with diarrhea when first noting symptoms but stated this has all resolved.   Past Medical History  Diagnosis Date  . Allergic rhinitis   . Asthmatic bronchitis   . Hyperlipidemia   . GERD (gastroesophageal reflux disease)   . IBS (irritable bowel syndrome)   . Hemorrhoids   . History of renal calculi   . Headache(784.0)   . History of herpes zoster   . Postherpetic neuralgia   . Vitamin D deficiency   . Seborrheic dermatitis     Current Outpatient Prescriptions on File Prior to Visit  Medication Sig Dispense Refill  . aspirin 81 MG tablet Take 81 mg by mouth daily.    . diazepam (VALIUM) 2 MG tablet TAKE 1 TABLET EVERY DAY AS NEEDED 90 tablet 1  . fluticasone (FLONASE) 50 MCG/ACT nasal spray Place 2 sprays into both nostrils daily.    . hydrocortisone (ANUSOL-HC) 25 MG suppository Place 1 suppository (25 mg total) rectally 2 (two) times daily. 12 suppository 0  . ranitidine (ZANTAC) 150 MG tablet Take 150 mg by mouth at bedtime as needed.    . simvastatin (ZOCOR) 40 MG tablet TAKE 1 TABLET BY MOUTH AT BEDTIME 30 tablet 5  . VIAGRA 50 MG tablet TAKE 1/2-1 TABLET BY MOUTH DAILY AS NEEDED FOR ERECTILE DYSFUNCTION 2 tablet 5  . Vitamin D, Ergocalciferol, (DRISDOL) 50000 UNITS CAPS capsule TAKE 1 CAPSULE (50,000 UNITS TOTAL) BY MOUTH EVERY 7 (SEVEN) DAYS. 4 capsule 1  . zolpidem (AMBIEN) 5 MG tablet TAKE 1 TABLET BY MOUTH AT BEDTIME AS NEEDED FOR SLEEP 15 tablet 1   No current facility-administered medications on file prior to visit.    Allergies  Allergen Reactions  . Codeine Nausea Only    Family History  Problem Relation Age of Onset  . Hypertension Mother   . Diabetes Mother   . Arthritis Mother   . Hyperlipidemia Mother   . Mental illness Mother    . Dementia Mother   . Colon cancer Neg Hx   . Stomach cancer Neg Hx   . Lung cancer Father 65    Deceased  . Arthritis Maternal Grandmother   . Alcoholism Paternal Grandfather   . Heart disease Brother 1    heart attack    Social History   Social History  . Marital Status: Single    Spouse Name: N/A  . Number of Children: 0  . Years of Education: N/A   Occupational History  . 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 Smoking status: Never Smoker   . Smokeless tobacco: Never Used  . Alcohol Use: Yes     Comment: occasional  . Drug Use: No  . Sexual Activity: Yes     Comment: with one male partner    Other Topics Concern  . None   Social History Narrative   Review of Systems - See HPI.  All other ROS are negative.  BP 130/60 mmHg  Pulse 99  Temp(Src) 98.2 F (36.8 C) (Oral)  Ht 5' 10.5" (1.791 m)  Wt 247 lb 9.6 oz (112.311 kg)  BMI 35.01 kg/m2  SpO2 98%  Physical Exam  Constitutional: He is well-developed, well-nourished, and in no distress.  HENT:  Head: Normocephalic and atraumatic.  Cardiovascular: Normal  rate, regular rhythm, normal heart sounds and intact distal pulses.   Pulmonary/Chest: Effort normal and breath sounds normal. No respiratory distress. He has no wheezes. He has no rales. He exhibits no tenderness.  Genitourinary: Prostate normal. Rectal exam shows fissure and tenderness. Rectal exam shows no external hemorrhoid, no internal hemorrhoid and no mass. Guaiac negative stool.  Vitals reviewed.    Assessment/Plan: Anal fissure Rx Proctofoam. Apply topically. Sitz baths TID. Increase fluids. Probiotic and fiber supplement. Follow-up if not resolving.

## 2015-10-02 NOTE — Assessment & Plan Note (Signed)
Rx Proctofoam. Apply topically. Sitz baths TID. Increase fluids. Probiotic and fiber supplement. Follow-up if not resolving.

## 2015-10-02 NOTE — Progress Notes (Signed)
Pre visit review using our clinic review tool, if applicable. No additional management support is needed unless otherwise documented below in the visit note. 

## 2015-10-02 NOTE — Telephone Encounter (Signed)
Relation to [id number] Call back number: [phone]  Ph[facility]acy: [id number] [place], [place]   Reason for call:  Patient states he has to go out of town and he was advised by pharmacy hydrocortisone-pramoxine [facility]) rectal foam is in need of PA. Patient states he does not want to rush Einar Pheasant and he is great but pharmacy suggested office can call insurance to expedite prior auth so patient can pick up Rx today.please advise

## 2015-10-02 NOTE — Telephone Encounter (Signed)
Patient informed of message below and will call back with information of alternate Rx

## 2015-10-02 NOTE — Patient Instructions (Signed)
Please increase fluids and continue your Metamucil as directed. I do not feel a hemorrhoid currently. I do see a small tear (fissure) in your anal sphincter with is the most likely culprit of symptoms.  Take a sitz bath after BM and at least 2-3 times per day.  This will promoting cleaning and healing. Use proctofoam as directed. Start a daily probiotic (I recommend Align). Follow-up if symptoms are not resolving.

## 2015-10-03 NOTE — Telephone Encounter (Signed)
Patient wanted to inform you he paid the cost of medication and it worked, patient wanted to say Thank You

## 2015-10-14 ENCOUNTER — Other Ambulatory Visit: Payer: Self-pay | Admitting: Family Medicine

## 2015-10-14 NOTE — Telephone Encounter (Signed)
Your patient 

## 2015-10-20 ENCOUNTER — Other Ambulatory Visit: Payer: Self-pay | Admitting: Physician Assistant

## 2015-10-20 ENCOUNTER — Encounter: Payer: Self-pay | Admitting: Physician Assistant

## 2015-10-20 DIAGNOSIS — K602 Anal fissure, unspecified: Secondary | ICD-10-CM

## 2015-10-21 MED ORDER — HYDROCORTISONE ACE-PRAMOXINE 1-1 % RE FOAM: 1.0000 | Freq: Two times a day (BID) | RECTAL | Status: DC

## 2015-10-21 MED ORDER — VITAMIN D (ERGOCALCIFEROL) 1.25 MG (50000 UNIT) PO CAPS: ORAL_CAPSULE | ORAL | Status: DC

## 2015-12-03 ENCOUNTER — Telehealth: Payer: Self-pay | Admitting: Physician Assistant

## 2015-12-03 ENCOUNTER — Encounter: Payer: Self-pay | Admitting: Physician Assistant

## 2015-12-03 ENCOUNTER — Telehealth: Payer: Self-pay | Admitting: *Deleted

## 2015-12-03 ENCOUNTER — Ambulatory Visit (INDEPENDENT_AMBULATORY_CARE_PROVIDER_SITE_OTHER): Payer: BLUE CROSS/BLUE SHIELD | Admitting: Physician Assistant

## 2015-12-03 VITALS — BP 135/51 | HR 103 | Temp 98.9°F | Ht 70.5 in | Wt 247.8 lb

## 2015-12-03 DIAGNOSIS — R0602 Shortness of breath: Secondary | ICD-10-CM | POA: Diagnosis not present

## 2015-12-03 MED ORDER — FLUTICASONE PROPIONATE 50 MCG/ACT NA SUSP
2.0000 | Freq: Every day | NASAL | Status: AC
Start: 2015-12-03 — End: ?

## 2015-12-03 MED ORDER — METHYLPREDNISOLONE ACETATE 40 MG/ML IJ SUSP
40.0000 mg | Freq: Once | INTRAMUSCULAR | Status: AC
  Administered 2015-12-03: 40 mg via INTRAMUSCULAR

## 2015-12-03 MED FILL — FLUTICASONE PROP 50 MCG SPR: 50 | 30 days supply | Qty: 16 | Fill #0

## 2015-12-03 NOTE — Patient Instructions (Signed)
Please use Flonase daily and begin a daily Claritin.  The shot given today will help with allergies and airway. Place a humidifier in the bedroom. Wear a mask when you will be in areas of construction in the home.  Follow-up if symptoms are not resolving.

## 2015-12-03 NOTE — Progress Notes (Signed)
Pre visit review using our clinic review tool, if applicable. No additional management support is needed unless otherwise documented below in the visit note. 

## 2015-12-03 NOTE — Assessment & Plan Note (Signed)
As a result of allergic irritants in the home. [place]. Exam good in office. Depo Medrol given to relieve sinus inflammation. Will begin Claritin and Flonase. Follow-up scheduled.

## 2015-12-03 NOTE — Telephone Encounter (Signed)
Pt called having sinus issues and SOB. Scheduled for 12/03/15 4:15pm and transferred to [facility] with [facility] due to SOB.

## 2015-12-03 NOTE — Telephone Encounter (Signed)
Patient seen by Teresita Madura this am.

## 2015-12-03 NOTE — Telephone Encounter (Signed)
[facility] - Client [place]  Patient Name: Russell Bullock  DOB: 04-Feb-1951    Initial Comment Caller states he gets short of breath with any physical activity, has appt today at 4:15, xfer from office for triage   Nurse Assessment  Nurse: Wynetta Emery, RN, [name] Date/Time Eilene Ghazi Time): 12/03/2015 8:59:17 AM  Confirm and document reason for call. If symptomatic, describe symptoms. You must click the next button to save text entered. ---[name] has been been short of breath intermittently onset one week ago  Has the patient traveled out of the country within the last 30 days? ---No  Does the patient have any new or worsening symptoms? ---Yes  Will a triage be completed? ---Yes  Related visit to physician within the last 2 weeks? ---No  Does the PT have any chronic conditions? (i.e. diabetes, asthma, etc.) ---Unknown  Is this a behavioral health or substance abuse call? ---No     Guidelines    Guideline Title Affirmed Question Affirmed Notes  Breathing Difficulty [1] MILD difficulty breathing (e.g., minimal/no SOB at rest, SOB with walking, pulse <100) AND [2] NEW-onset or WORSE than normal    Final Disposition User   See Physician within 4 Hours (or PCP triage) Wynetta Emery, RN, [name]    Referrals  REFERRED TO PCP OFFICE   Disagree/Comply: Leta Baptist

## 2015-12-03 NOTE — Progress Notes (Signed)
Patient presents to clinic today c/o 1 week of sinus pressure, ear pressure, nasal congestion, rhinorrhea associated with some intermittent SOB and chest tightness. Denies chest congestion. Denies chest pain, palpitations, lightheadedness or dizziness. Patient with history of allergic rhinitis and asthmatic bronchitis. Is not taking anything for symptoms.  Past Medical History  Diagnosis Date  . Allergic rhinitis   . Asthmatic bronchitis   . Hyperlipidemia   . GERD (gastroesophageal reflux disease)   . IBS (irritable bowel syndrome)   . Hemorrhoids   . History of renal calculi   . Headache(784.0)   . History of herpes zoster   . Postherpetic neuralgia   . Vitamin D deficiency   . Seborrheic dermatitis     Current Outpatient Prescriptions on File Prior to Visit  Medication Sig Dispense Refill  . aspirin 81 MG tablet Take 81 mg by mouth daily.    . diazepam (VALIUM) 2 MG tablet TAKE 1 TABLET EVERY DAY AS NEEDED 90 tablet 1  . ranitidine (ZANTAC) 150 MG tablet Take 150 mg by mouth at bedtime as needed.    . simvastatin (ZOCOR) 40 MG tablet TAKE 1 TABLET BY MOUTH AT BEDTIME 30 tablet 5  . VIAGRA 50 MG tablet TAKE 1/2-1 TABLET BY MOUTH DAILY AS NEEDED FOR ERECTILE DYSFUNCTION 2 tablet 5  . Vitamin D, Ergocalciferol, (DRISDOL) 50000 UNITS CAPS capsule TAKE 1 CAPSULE (50,000 UNITS TOTAL) BY MOUTH EVERY 7 (SEVEN) DAYS. 4 capsule 1  . zolpidem (AMBIEN) 5 MG tablet TAKE 1 TABLET BY MOUTH AT BEDTIME AS NEEDED FOR SLEEP 15 tablet 1   No current facility-administered medications on file prior to visit.    Allergies  Allergen Reactions  . Codeine Nausea Only    Family History  Problem Relation Age of Onset  . Hypertension Mother   . Diabetes Mother   . Arthritis Mother   . Hyperlipidemia Mother   . Mental illness Mother   . Dementia Mother   . Colon cancer Neg Hx   . Stomach cancer Neg Hx   . Lung cancer Father 41    Deceased  . Arthritis Maternal Grandmother   . Alcoholism  Paternal Grandfather   . Heart disease Brother 62    heart attack    Social History   Social History  . Marital Status: Single    Spouse Name: N/A  . Number of Children: 0  . Years of Education: N/A   Occupational History  . 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 Smoking status: Never Smoker   . Smokeless tobacco: Never Used  . Alcohol Use: Yes     Comment: occasional  . Drug Use: No  . Sexual Activity: Yes     Comment: with one male partner    Other Topics Concern  . None   Social History Narrative   Review of Systems - See HPI.  All other ROS are negative.  BP 135/51 mmHg  Pulse 103  Temp(Src) 98.9 F (37.2 C) (Oral)  Ht 5' 10.5" (1.791 m)  Wt 247 lb 12.8 oz (112.401 kg)  BMI 35.04 kg/m2  SpO2 100%  Physical Exam  Constitutional: He is oriented to person, place, and time and well-developed, well-nourished, and in no distress.  HENT:  Head: Normocephalic and atraumatic.  Right Ear: Tympanic membrane and external ear normal.  Left Ear: Tympanic membrane and external ear normal.  Nose: Mucosal edema and rhinorrhea present.  Mouth/Throat: Uvula is midline and oropharynx is  clear and moist.  Eyes: Conjunctivae are normal.  Cardiovascular: Normal rate, regular rhythm, normal heart sounds and intact distal pulses.   Pulmonary/Chest: Effort normal and breath sounds normal. No respiratory distress. He has no wheezes. He has no rales. He exhibits no tenderness.  Neurological: He is alert and oriented to person, place, and time.  Skin: Skin is warm and dry. No rash noted.  Psychiatric: Affect normal.  Vitals reviewed.  Assessment/Plan: SOB (shortness of breath) As a result of allergic irritants in the home. [place]. Exam good in office. Depo Medrol given to relieve sinus inflammation. Will begin Claritin and Flonase. Follow-up scheduled.

## 2016-01-15 ENCOUNTER — Encounter: Payer: Self-pay | Admitting: Physician Assistant

## 2016-01-16 MED ORDER — DIAZEPAM 2 MG PO TABS: ORAL_TABLET | ORAL | Status: AC

## 2016-03-13 ENCOUNTER — Other Ambulatory Visit: Payer: Self-pay | Admitting: Physician Assistant

## 2016-03-13 NOTE — Telephone Encounter (Signed)
Error

## 2016-03-14 NOTE — Telephone Encounter (Signed)
Rx's sent to the pharmacy by e-script.  Pt needs office visit with fasting labs.//AB/CMA

## 2016-05-07 ENCOUNTER — Telehealth: Payer: Self-pay | Admitting: *Deleted

## 2016-05-07 MED ORDER — SIMVASTATIN 40 MG PO TABS: 40.0000 mg | ORAL_TABLET | Freq: Every day | ORAL | Status: AC

## 2016-05-07 NOTE — Telephone Encounter (Signed)
Rx request to pharmacy/SLS  

## 2016-05-09 ENCOUNTER — Other Ambulatory Visit: Payer: Self-pay | Admitting: Physician Assistant

## 2016-06-22 ENCOUNTER — Other Ambulatory Visit: Payer: Self-pay | Admitting: Physician Assistant

## 2018-03-10 ENCOUNTER — Encounter: Payer: Self-pay | Admitting: Emergency Medicine
